# Patient Record
Sex: Male | Born: 1975 | Race: Black or African American | Hispanic: No | Marital: Single | Smoking: Never smoker
Health system: Southern US, Community
[De-identification: ages and names within clinical notes are randomized; demographics above are authoritative.]

## PROBLEM LIST (undated history)

## (undated) DIAGNOSIS — K469 Unspecified abdominal hernia without obstruction or gangrene: Secondary | ICD-10-CM

---

## 1999-08-15 ENCOUNTER — Emergency Department (HOSPITAL_COMMUNITY): Admission: EM | Admit: 1999-08-15 | Discharge: 1999-08-15 | Payer: Self-pay | Admitting: Emergency Medicine

## 1999-08-17 ENCOUNTER — Emergency Department (HOSPITAL_COMMUNITY): Admission: EM | Admit: 1999-08-17 | Discharge: 1999-08-17 | Payer: Self-pay | Admitting: Emergency Medicine

## 2000-07-07 ENCOUNTER — Encounter: Payer: Self-pay | Admitting: Emergency Medicine

## 2000-07-07 ENCOUNTER — Emergency Department (HOSPITAL_COMMUNITY): Admission: EM | Admit: 2000-07-07 | Discharge: 2000-07-07 | Payer: Self-pay | Admitting: Emergency Medicine

## 2001-01-25 ENCOUNTER — Emergency Department (HOSPITAL_COMMUNITY): Admission: EM | Admit: 2001-01-25 | Discharge: 2001-01-25 | Payer: Self-pay

## 2001-01-29 ENCOUNTER — Emergency Department (HOSPITAL_COMMUNITY): Admission: EM | Admit: 2001-01-29 | Discharge: 2001-01-30 | Payer: Self-pay | Admitting: Emergency Medicine

## 2001-01-29 ENCOUNTER — Encounter: Payer: Self-pay | Admitting: Emergency Medicine

## 2001-02-09 ENCOUNTER — Emergency Department (HOSPITAL_COMMUNITY): Admission: EM | Admit: 2001-02-09 | Discharge: 2001-02-09 | Payer: Self-pay | Admitting: Emergency Medicine

## 2001-02-09 ENCOUNTER — Encounter: Payer: Self-pay | Admitting: Emergency Medicine

## 2002-05-19 ENCOUNTER — Emergency Department (HOSPITAL_COMMUNITY): Admission: EM | Admit: 2002-05-19 | Discharge: 2002-05-19 | Payer: Self-pay | Admitting: Emergency Medicine

## 2002-06-26 ENCOUNTER — Encounter: Payer: Self-pay | Admitting: Emergency Medicine

## 2002-06-26 ENCOUNTER — Emergency Department (HOSPITAL_COMMUNITY): Admission: EM | Admit: 2002-06-26 | Discharge: 2002-06-26 | Payer: Self-pay | Admitting: Emergency Medicine

## 2002-07-02 ENCOUNTER — Emergency Department (HOSPITAL_COMMUNITY): Admission: EM | Admit: 2002-07-02 | Discharge: 2002-07-02 | Payer: Self-pay | Admitting: Emergency Medicine

## 2002-09-07 ENCOUNTER — Emergency Department (HOSPITAL_COMMUNITY): Admission: EM | Admit: 2002-09-07 | Discharge: 2002-09-07 | Payer: Self-pay | Admitting: Emergency Medicine

## 2002-09-07 ENCOUNTER — Encounter: Payer: Self-pay | Admitting: Emergency Medicine

## 2002-10-24 ENCOUNTER — Emergency Department (HOSPITAL_COMMUNITY): Admission: EM | Admit: 2002-10-24 | Discharge: 2002-10-25 | Payer: Self-pay | Admitting: Emergency Medicine

## 2004-04-13 ENCOUNTER — Emergency Department (HOSPITAL_COMMUNITY): Admission: EM | Admit: 2004-04-13 | Discharge: 2004-04-13 | Payer: Self-pay | Admitting: Emergency Medicine

## 2004-09-24 ENCOUNTER — Emergency Department (HOSPITAL_COMMUNITY): Admission: EM | Admit: 2004-09-24 | Discharge: 2004-09-24 | Payer: Self-pay | Admitting: Emergency Medicine

## 2004-12-22 ENCOUNTER — Emergency Department (HOSPITAL_COMMUNITY): Admission: EM | Admit: 2004-12-22 | Discharge: 2004-12-22 | Payer: Self-pay | Admitting: Emergency Medicine

## 2005-03-27 ENCOUNTER — Ambulatory Visit: Payer: Self-pay | Admitting: Internal Medicine

## 2005-03-27 ENCOUNTER — Ambulatory Visit: Payer: Self-pay | Admitting: *Deleted

## 2005-10-29 IMAGING — US US SCROTUM
1 series · 14 of 25 positions shown · non-contrast
Comparison: none

*****DUPLICATE COPY ****
 For exam association in [facility] - no change from original report - 09/29/04.
CLINICAL DATA: Pain.  Please evaluate for possible torsion.
 SCROTAL ULTRASOUND
 The testicles are normal in size and contour and there is normal arterial and venous color flow associated with the testicles.  The right epididymis is enlarged and there is increased color flow associated with the right epididymis ? findings consistent with epididymitis.  The left epididymis has a normal appearance.  There are small bilateral hydroceles.  There is no varicocele.

[Series 1: scrotum · 0.07mm/px · 14 of 44 slices shown]
[im 1/44]
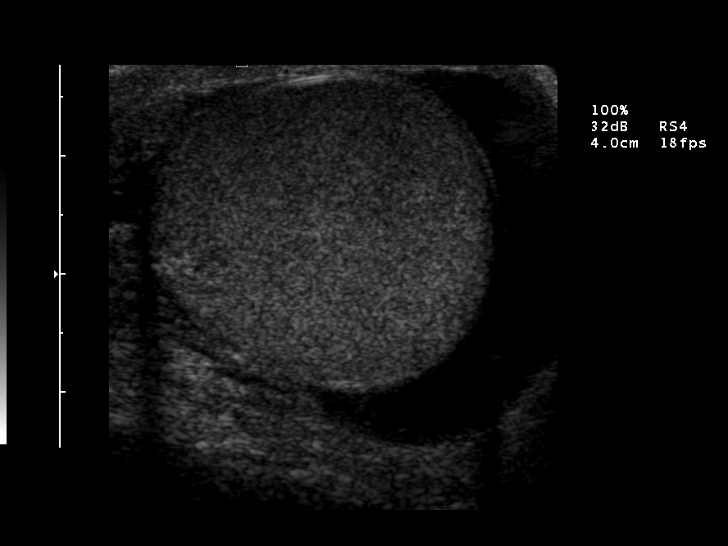
[im 4/44]
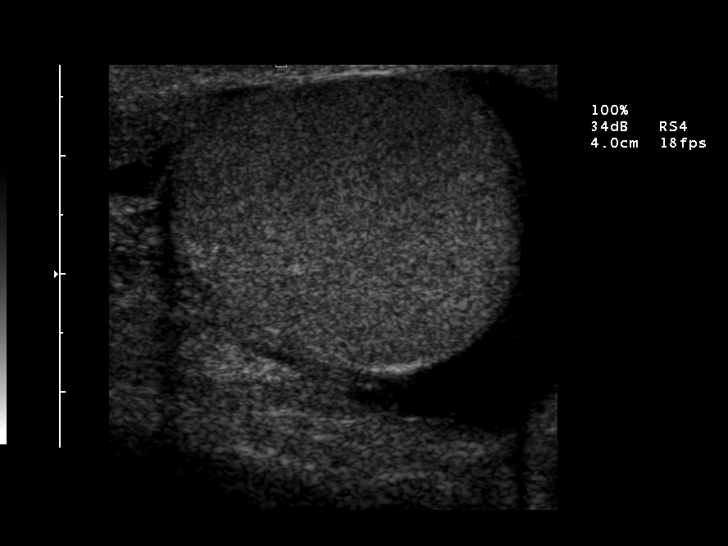
[im 8/44]
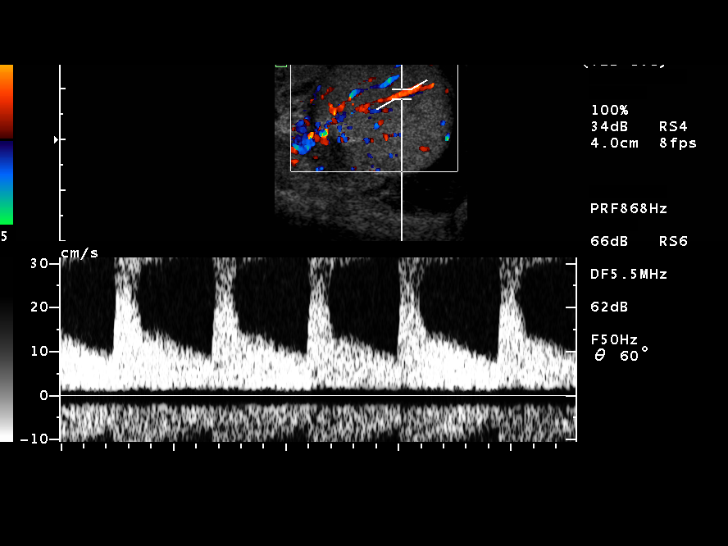
[im 11/44]
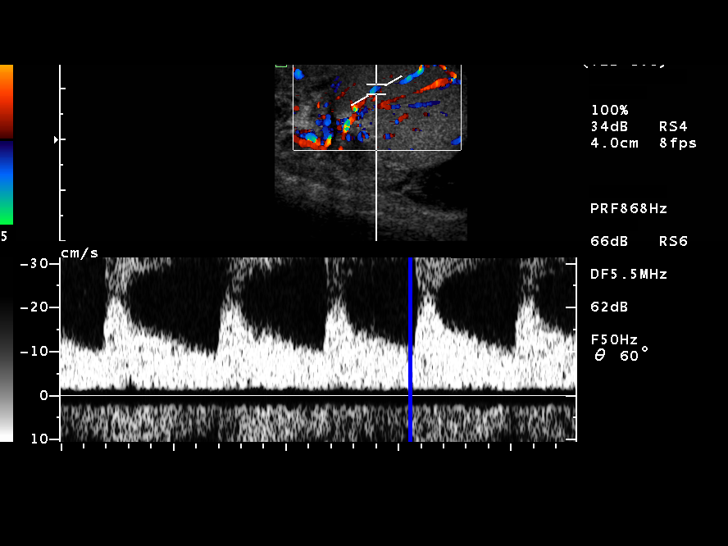
[im 15/44]
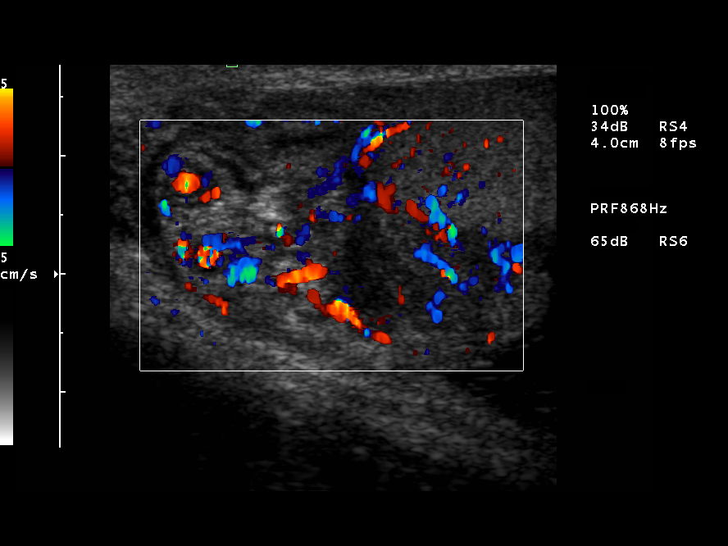
[im 17/44]
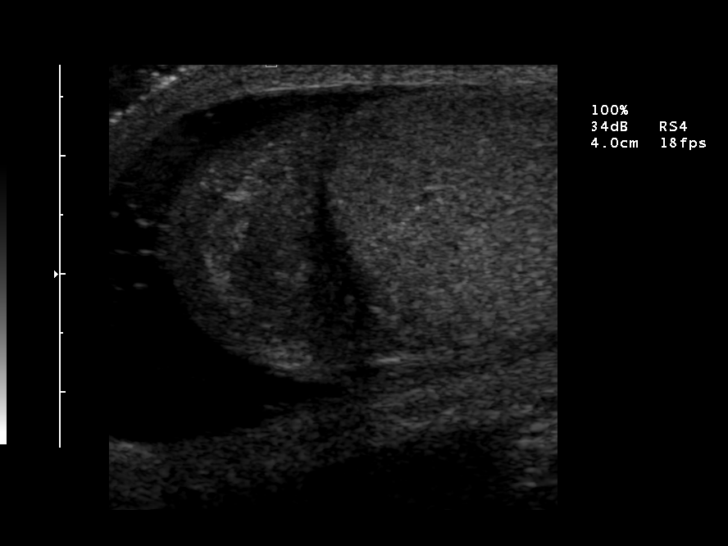
[im 20/44]
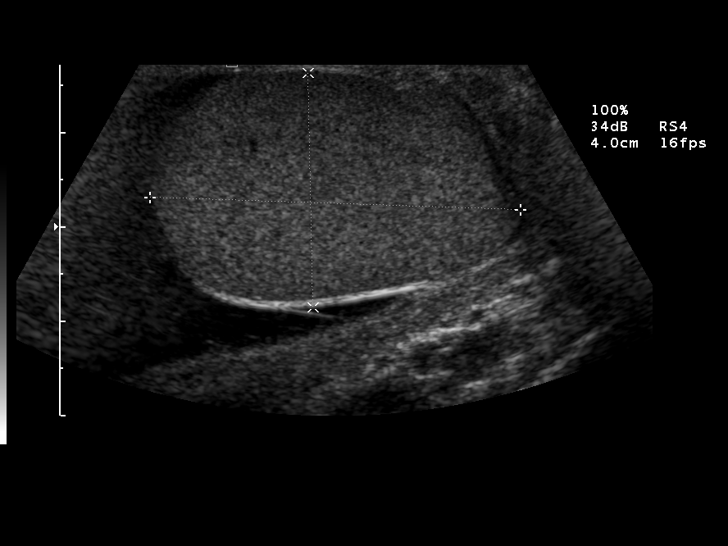
[im 24/44]
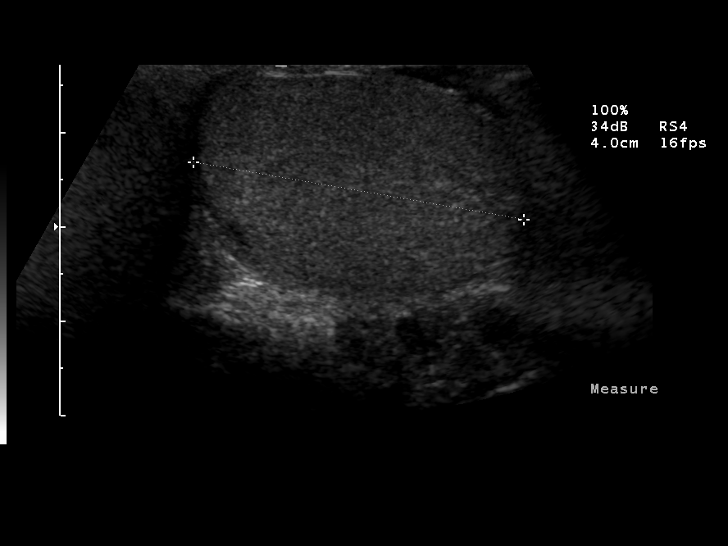
[im 27/44]
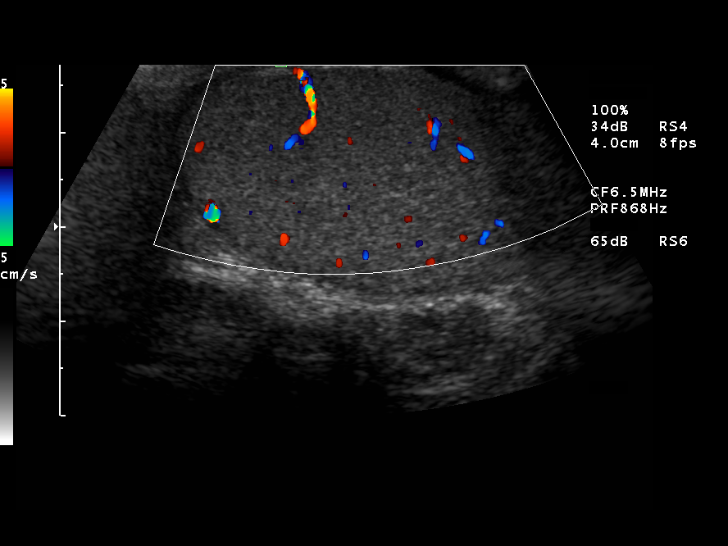
[im 29/44]
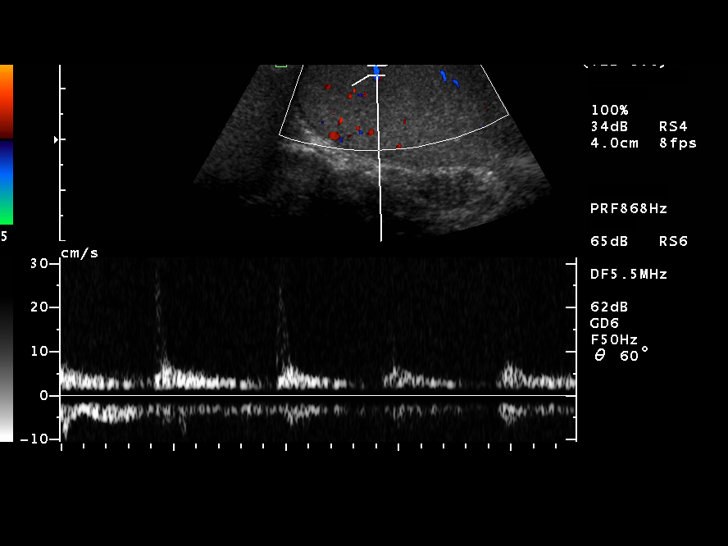
[im 33/44]
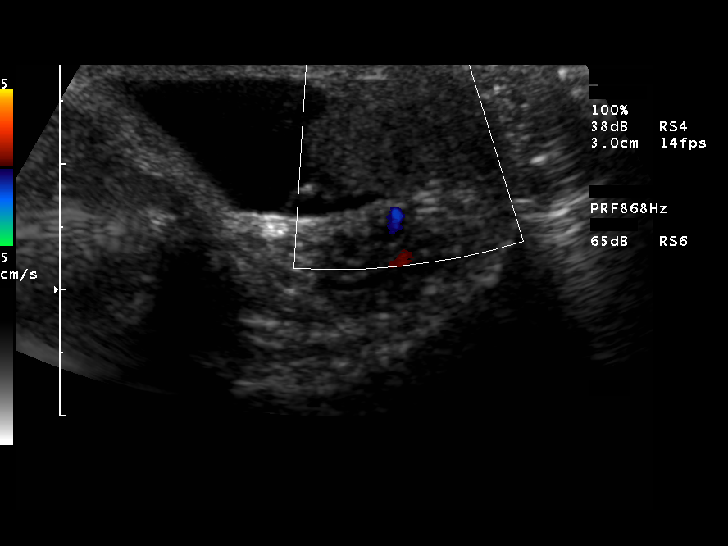
[im 36/44]
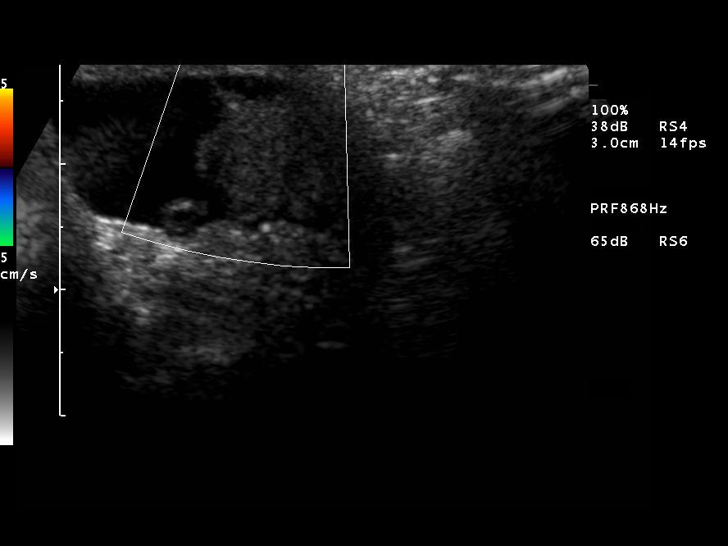
[im 40/44]
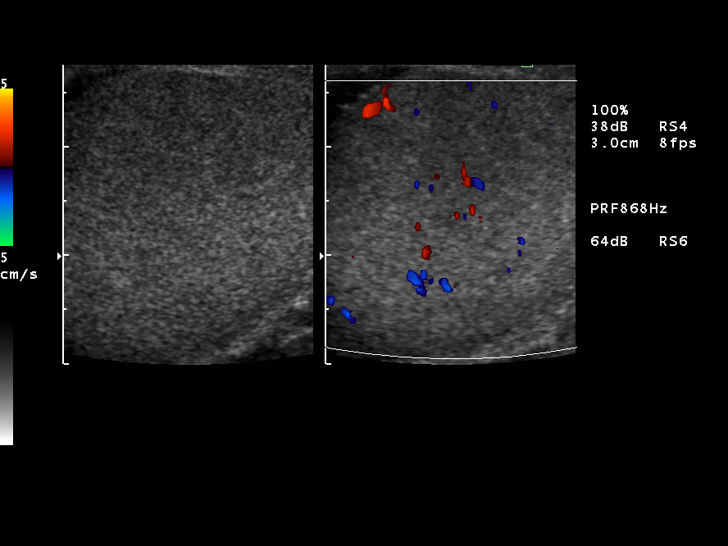
[im 44/44]
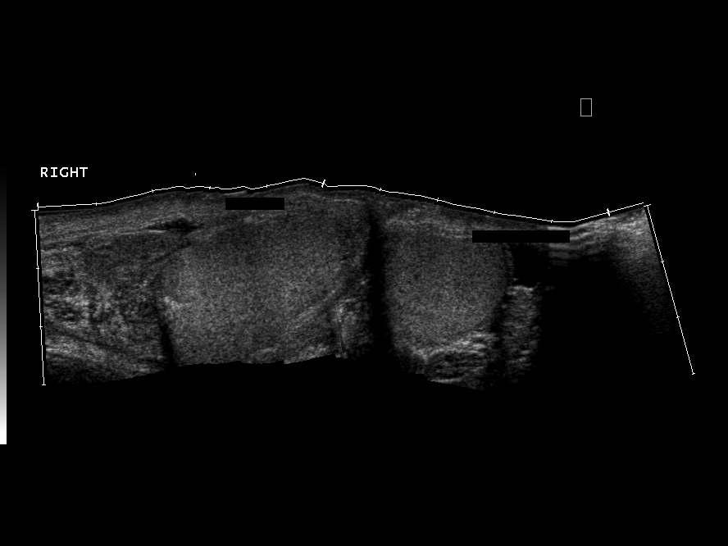

[14 of 25 positions shown; findings below may reference images not displayed]

IMPRESSION: No evidence for testicular torsion.  Findings are consistent with a right side epididymitis.  Small bilateral hydroceles.

## 2006-01-11 ENCOUNTER — Emergency Department (HOSPITAL_COMMUNITY): Admission: EM | Admit: 2006-01-11 | Discharge: 2006-01-11 | Payer: Self-pay | Admitting: Emergency Medicine

## 2006-01-26 IMAGING — US US SCROTUM
1 series · 14 of 25 positions shown · non-contrast
Comparison: none

CLINICAL DATA: Swollen right testicle.
 US SCROTUM/US ARTERIAL VENOUS FLOW DOPPLER:519
 Scans over the scrotum were performed.  Both testicles are normal in size and in echogenicity.  There is blood flow to both testicles with somewhat increased flow on the right relative to the left.  Also, there is prominence of the right epididymis, which is inhomogeneous with increased flow.  This suggests right epididymitis.  The left epididymis appears normal.  A right hydrocele is present.  No varicocele is noted.

[Series 1: scrot · 0.15mm/px · 14 of 40 slices shown]
[im 1/40]
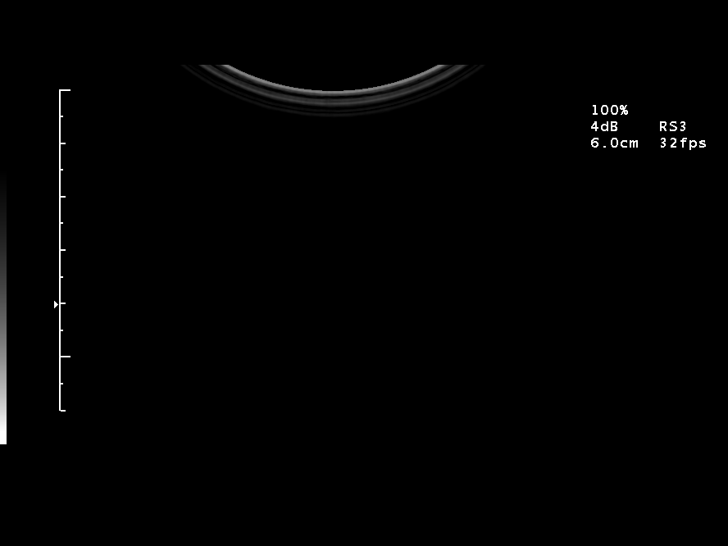
[im 4/40]
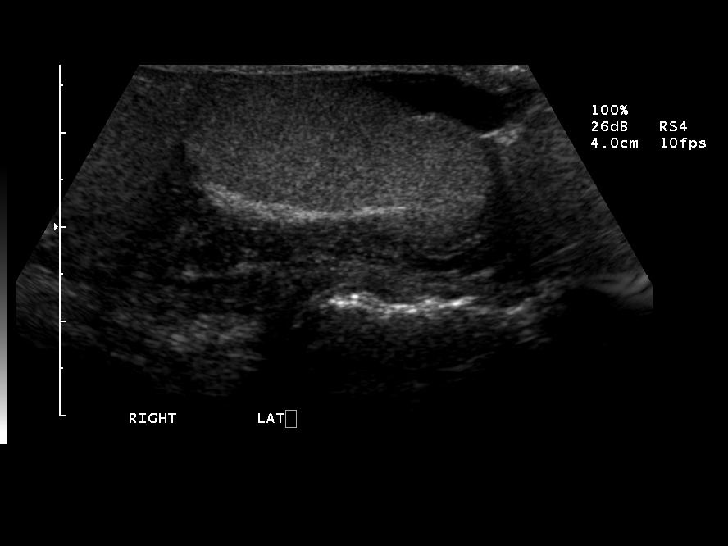
[im 7/40]
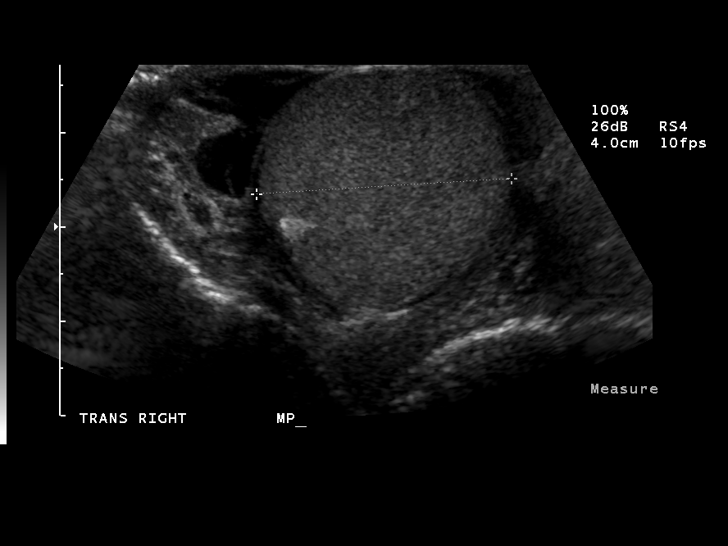
[im 10/40]
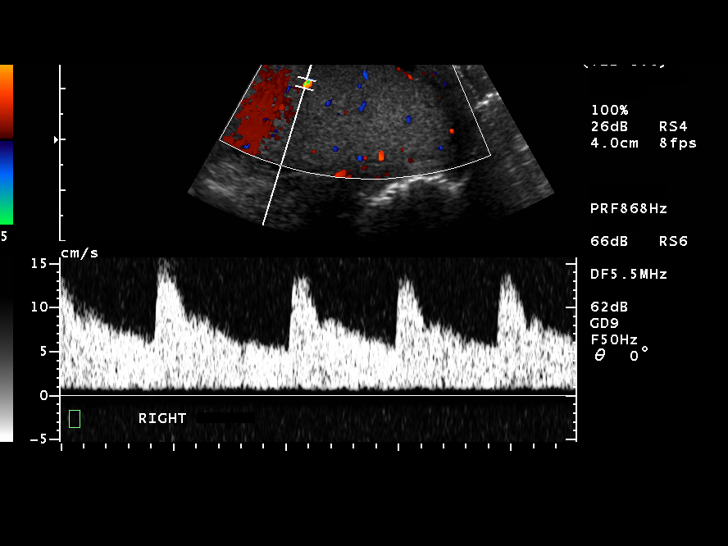
[im 14/40]
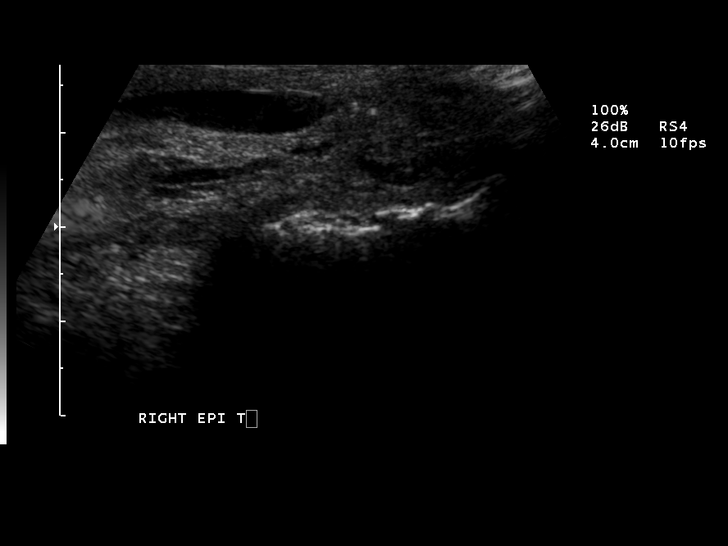
[im 15/40]
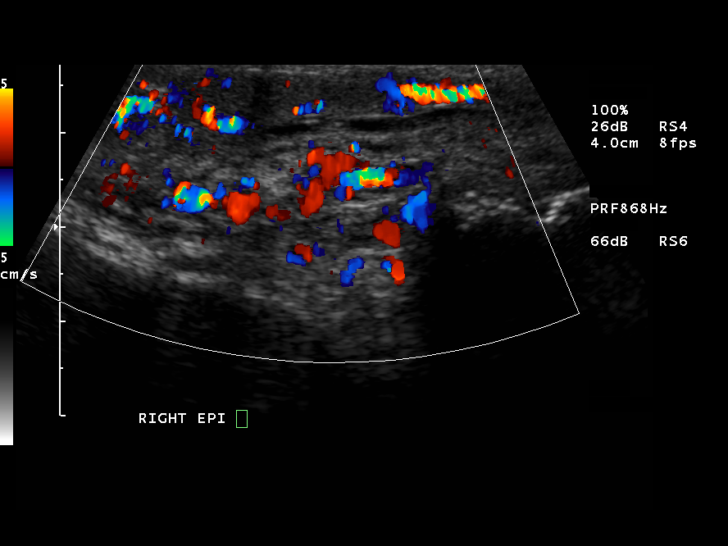
[im 18/40]
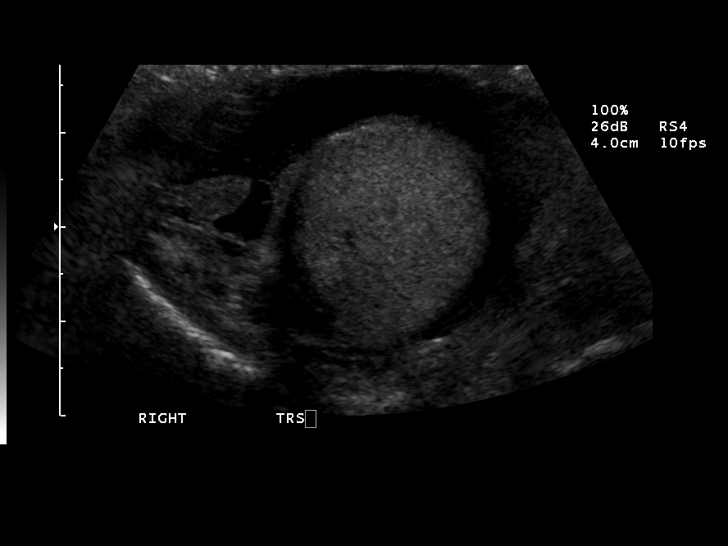
[im 22/40]
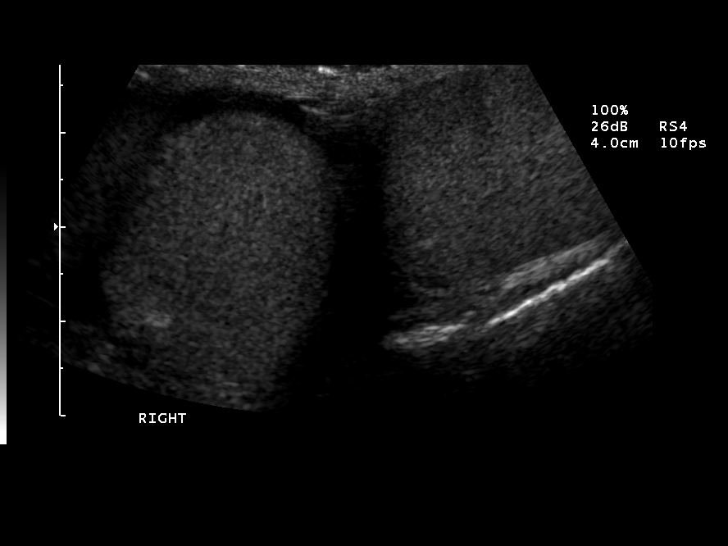
[im 25/40]
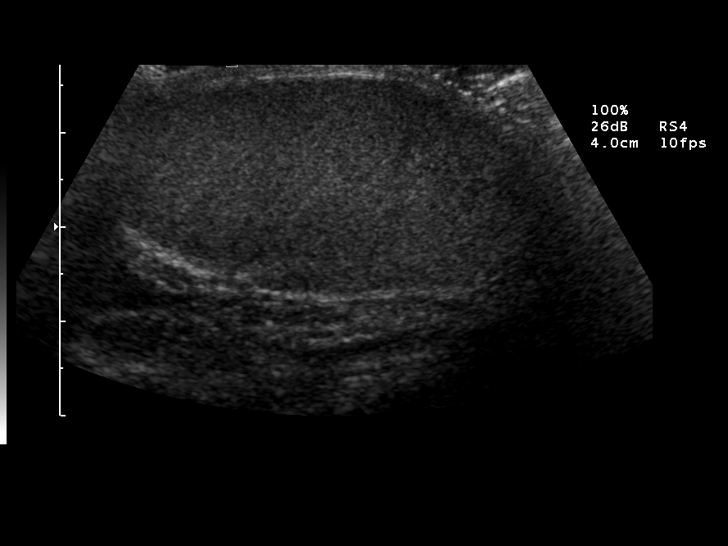
[im 27/40]
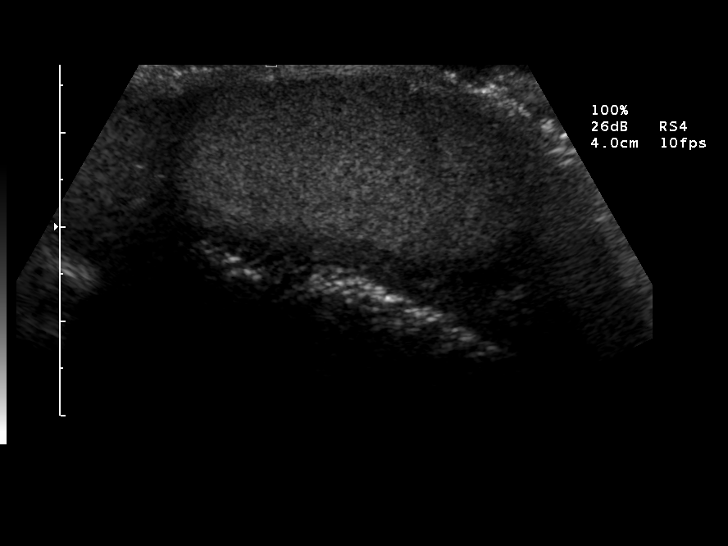
[im 30/40]
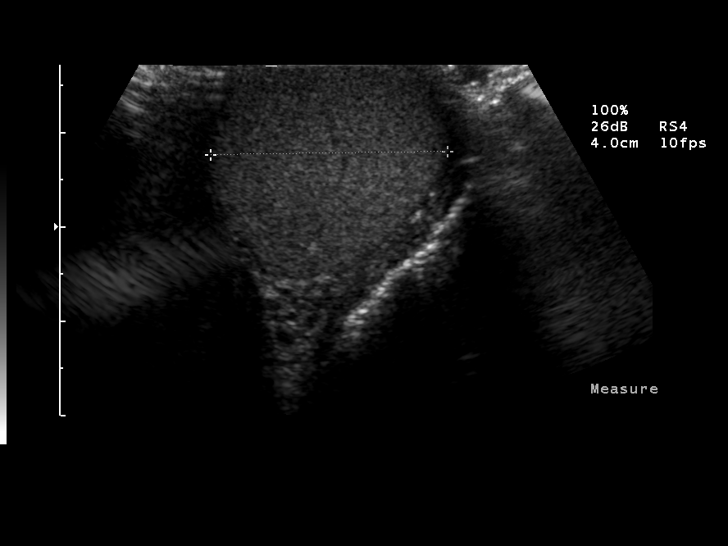
[im 33/40]
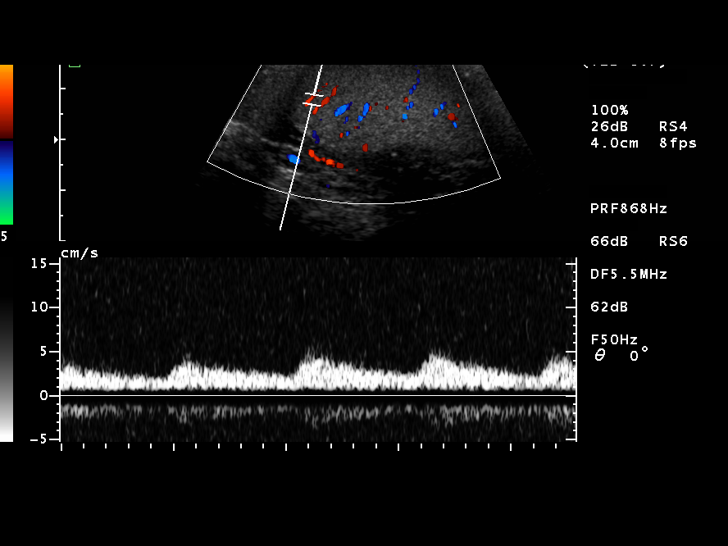
[im 36/40]
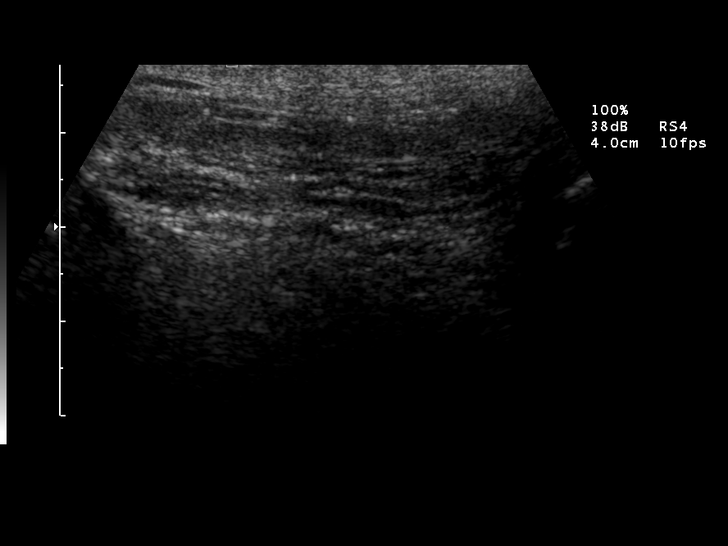
[im 40/40]
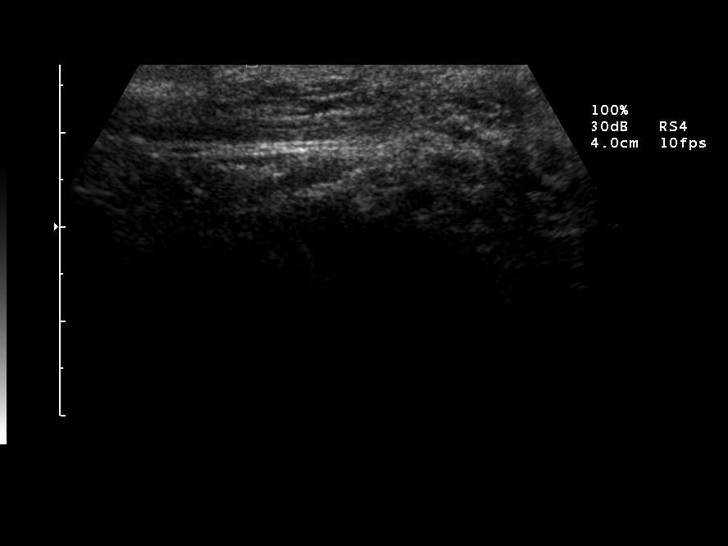

[14 of 25 positions shown; findings below may reference images not displayed]

IMPRESSION: 1.  Findings compatible with right epididymitis and right hydrocele.  There is a subtle increase in flow to the right testicle itself and orchitis cannot be excluded.  
 2.  No intratesticular abnormality is seen.

## 2006-02-27 ENCOUNTER — Emergency Department (HOSPITAL_COMMUNITY): Admission: EM | Admit: 2006-02-27 | Discharge: 2006-02-27 | Payer: Self-pay | Admitting: Emergency Medicine

## 2006-03-06 ENCOUNTER — Emergency Department (HOSPITAL_COMMUNITY): Admission: EM | Admit: 2006-03-06 | Discharge: 2006-03-06 | Payer: Self-pay | Admitting: Emergency Medicine

## 2006-05-02 ENCOUNTER — Emergency Department (HOSPITAL_COMMUNITY): Admission: EM | Admit: 2006-05-02 | Discharge: 2006-05-02 | Payer: Self-pay | Admitting: Family Medicine

## 2006-05-07 ENCOUNTER — Emergency Department (HOSPITAL_COMMUNITY): Admission: EM | Admit: 2006-05-07 | Discharge: 2006-05-07 | Payer: Self-pay | Admitting: Family Medicine

## 2006-06-29 ENCOUNTER — Emergency Department (HOSPITAL_COMMUNITY): Admission: EM | Admit: 2006-06-29 | Discharge: 2006-06-30 | Payer: Self-pay | Admitting: Emergency Medicine

## 2007-08-03 IMAGING — US US ART/VEN ABD/PELV/SCROTUM DOPPLER COMPLETE
1 series · 14 of 25 positions shown · non-contrast
Comparison: none

CLINICAL DATA: Right-sided testicular pain and swelling, 30-year-old male.  
SCROTAL ULTRASOUND:
DOPPLER ULTRASOUND OF THE TESTICLES:
TECHNIQUE: Complete ultrasound examination of the testicles, epididymis, and other scrotal structures was performed.  Color and spectral Doppler ultrasound were also utilized to evaluate blood flow to the testicles.

[Series 1: unknown · 0.09mm/px · 14 of 37 slices shown]
[im 1/37]
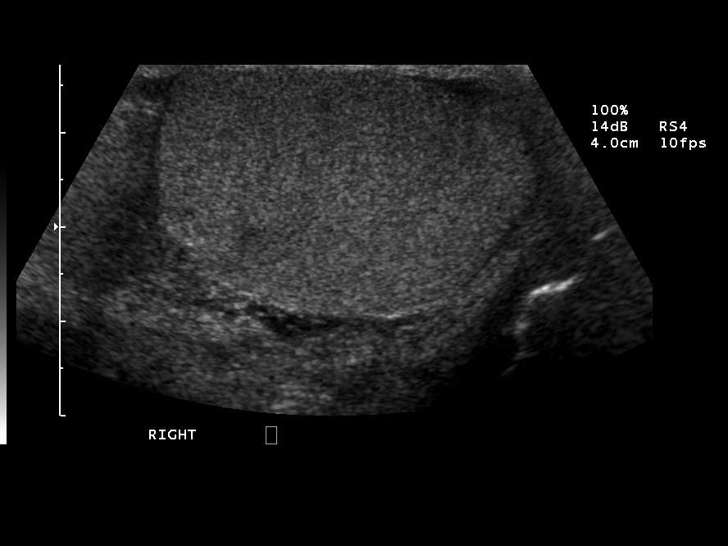
[im 4/37]
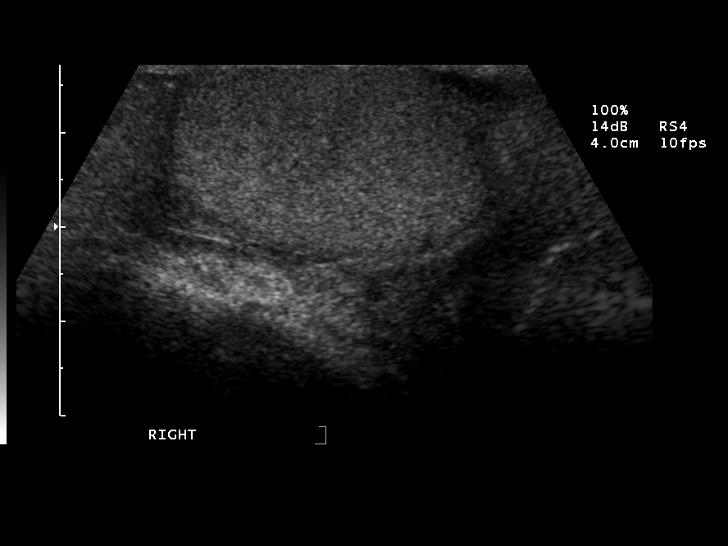
[im 7/37]
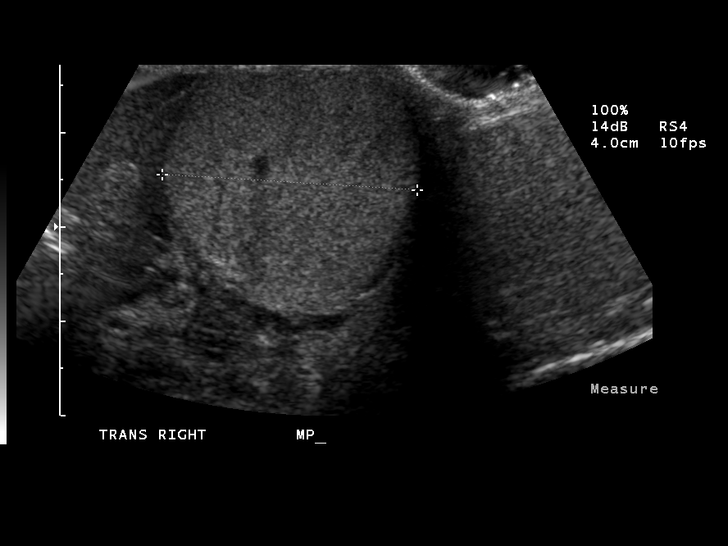
[im 10/37]
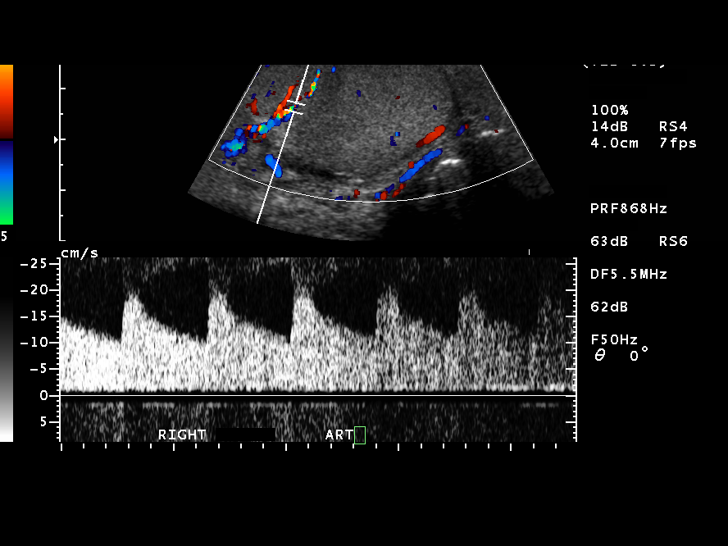
[im 13/37]
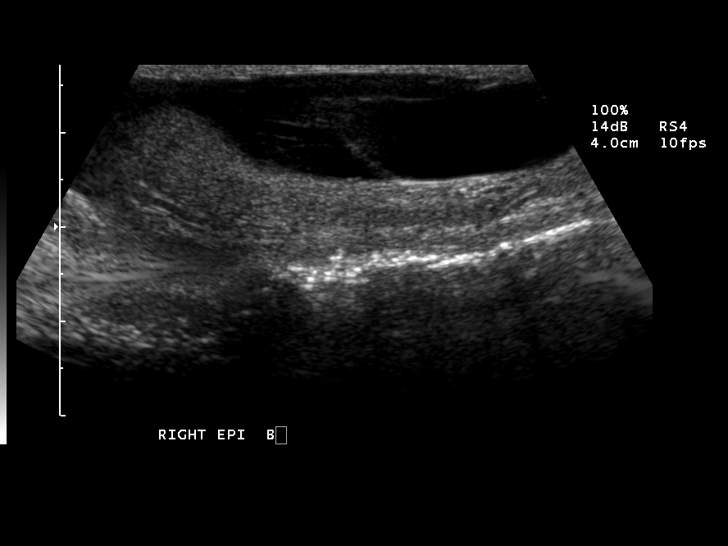
[im 14/37]
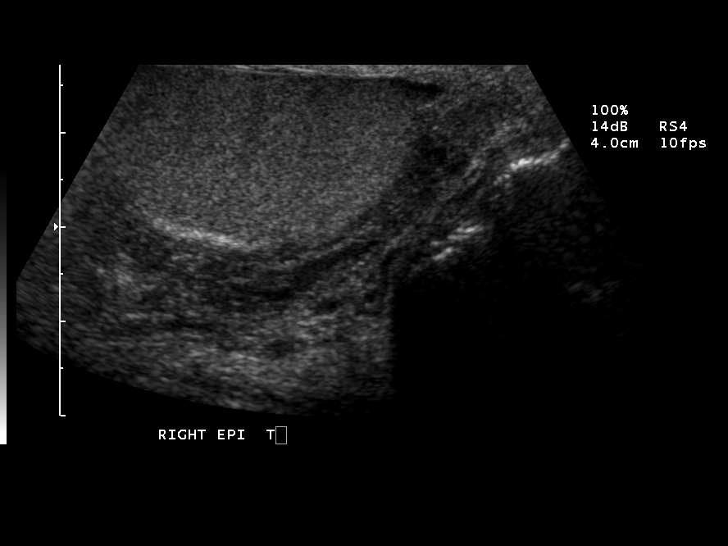
[im 17/37]
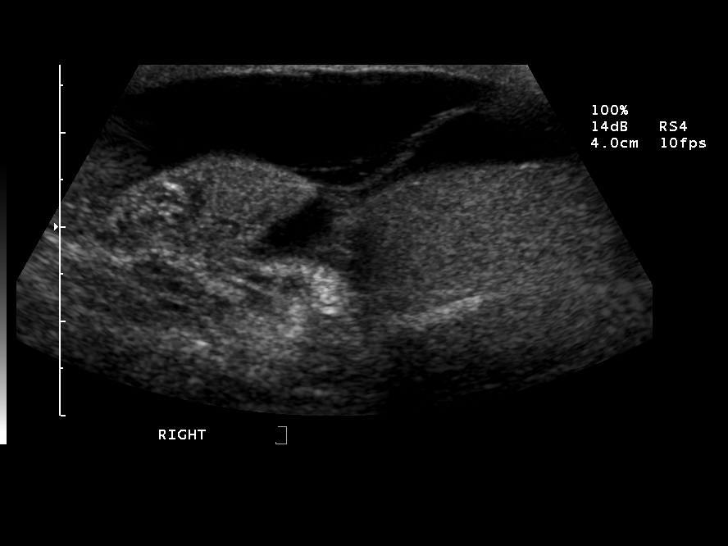
[im 20/37]
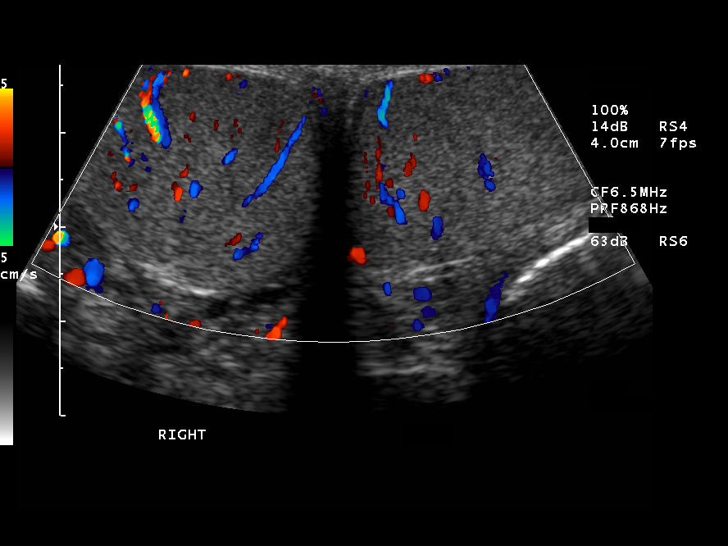
[im 23/37]
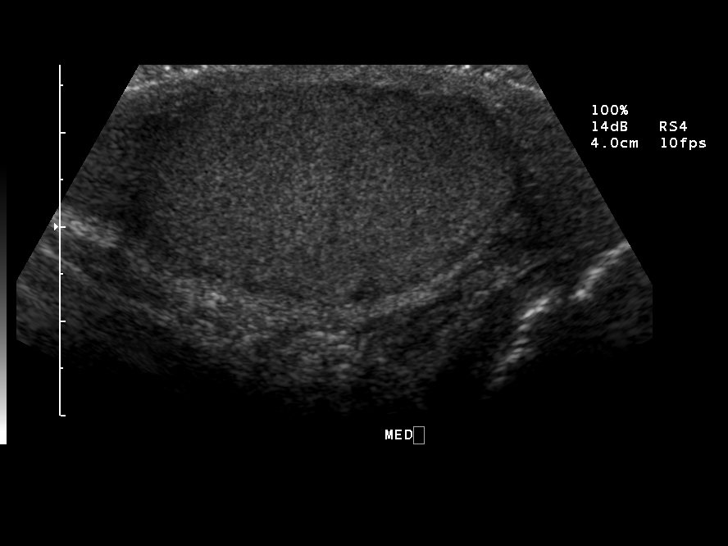
[im 25/37]
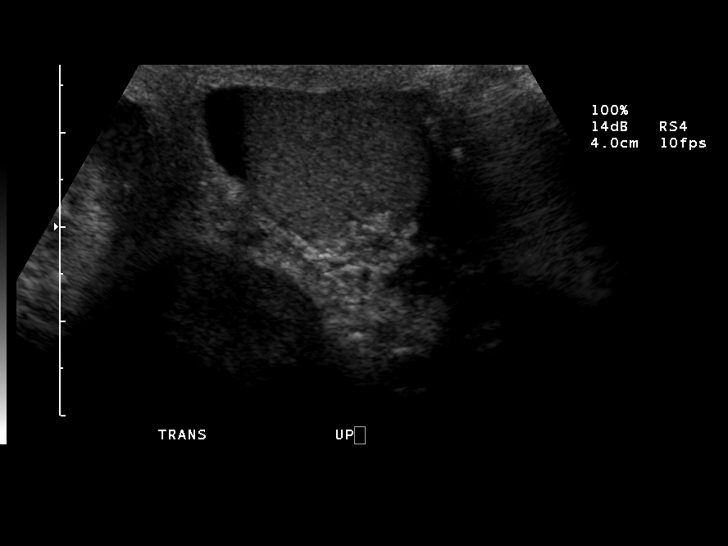
[im 28/37]
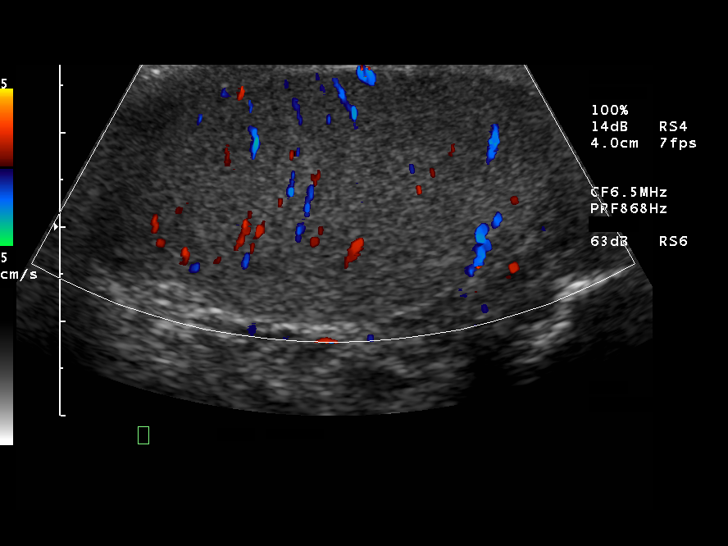
[im 31/37]
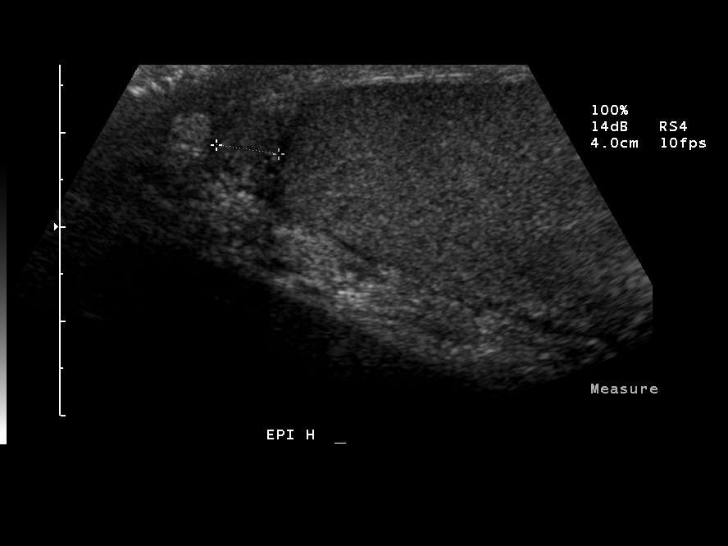
[im 34/37]
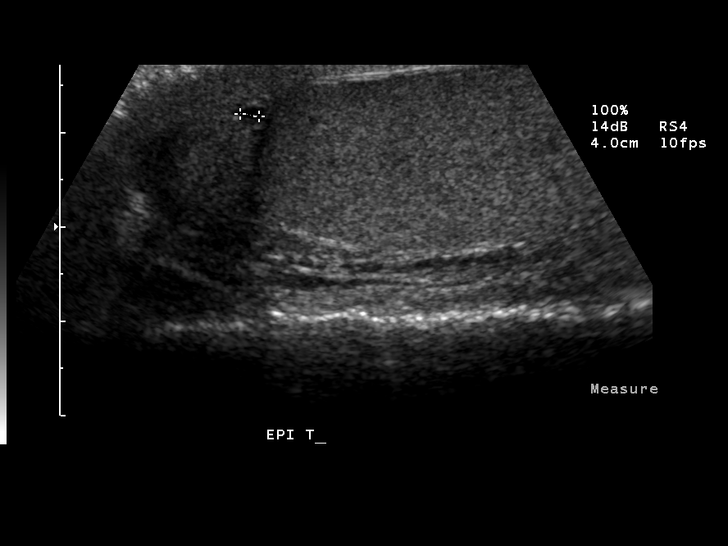
[im 37/37]
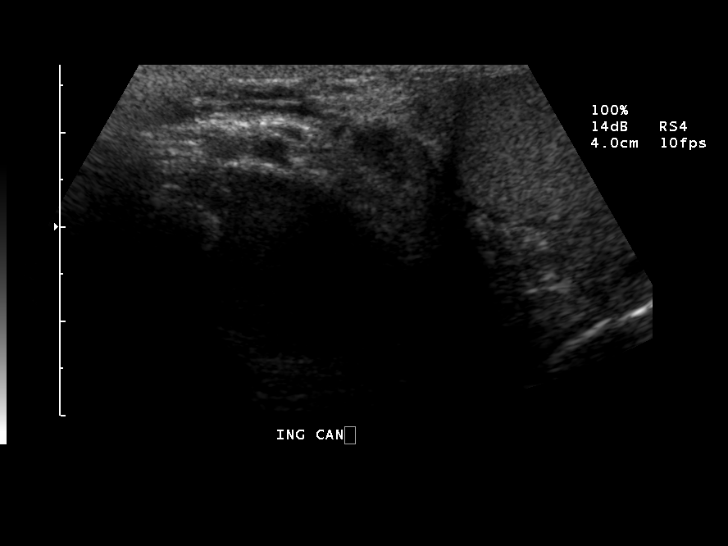

[14 of 25 positions shown; findings below may reference images not displayed]

FINDINGS: Right testis:  4.2 x 2.7 x 2.5 cm.
Right epididymis measures 1.8 cm maximally.  
Left testis:  4.5 x 2.4 x 2.3 cm.
Left epididymis measures 7 mm maximally.
The testes are equal in echogenicity bilaterally without focal mass.  There is markedly increased color flow vascularity to the right epididymal head with internal heterogeneity.  Mild to moderate right hydrocele is present.  No left-sided hydrocele is present.  
Low resistance arterial and venous waveforms are present in both testes.
IMPRESSION: 1.  Findings are consistent with right-sided epididymitis.  
2.  No intratesticular mass or evidence for torsion.

## 2008-04-28 ENCOUNTER — Emergency Department (HOSPITAL_COMMUNITY): Admission: EM | Admit: 2008-04-28 | Discharge: 2008-04-28 | Payer: Self-pay | Admitting: Emergency Medicine

## 2008-05-01 ENCOUNTER — Emergency Department (HOSPITAL_COMMUNITY): Admission: EM | Admit: 2008-05-01 | Discharge: 2008-05-01 | Payer: Self-pay | Admitting: Family Medicine

## 2009-08-26 ENCOUNTER — Emergency Department (HOSPITAL_COMMUNITY): Admission: EM | Admit: 2009-08-26 | Discharge: 2009-08-26 | Payer: Self-pay | Admitting: Emergency Medicine

## 2017-03-10 ENCOUNTER — Ambulatory Visit (HOSPITAL_COMMUNITY)
Admission: EM | Admit: 2017-03-10 | Discharge: 2017-03-10 | Disposition: A | Discharge status: Home / Self Care | Payer: Self-pay | Attending: Family Medicine | Admitting: Family Medicine

## 2017-03-10 ENCOUNTER — Encounter (HOSPITAL_COMMUNITY): Payer: Self-pay | Admitting: Emergency Medicine

## 2017-03-10 DIAGNOSIS — J40 Bronchitis, not specified as acute or chronic: Secondary | ICD-10-CM

## 2017-03-10 DIAGNOSIS — J4521 Mild intermittent asthma with (acute) exacerbation: Secondary | ICD-10-CM

## 2017-03-10 MED ORDER — ALBUTEROL SULFATE HFA 108 (90 BASE) MCG/ACT IN AERS: 2.0000 | INHALATION_SPRAY | RESPIRATORY_TRACT | 1 refills | Status: DC | PRN

## 2017-03-10 MED ORDER — PREDNISONE 20 MG PO TABS: ORAL_TABLET | ORAL | 0 refills | Status: DC

## 2017-03-10 NOTE — ED Triage Notes (Signed)
Pt here for SOB onset 2 weeks associated w/prod cough and wheezing  A&O x4... NAD

## 2017-03-10 NOTE — ED Provider Notes (Signed)
[facility]    CSN: [id number] Arrival date & time: 03/10/17  1312     History   Chief Complaint Chief Complaint  Patient presents with  . Shortness of Breath    HPI Noah Miller is a 41 y.o. male.   Pt here for SOB onset 2 weeks associated w/prod cough and wheezing.  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  He has a h/o asthma.  Patient denies fever.      History reviewed. No pertinent past medical history.  There are no active problems to display for this patient.   History reviewed. No pertinent surgical history.     Home Medications    Prior to Admission medications   Medication Sig Start Date End Date Taking? Authorizing Provider  albuterol (PROVENTIL HFA;VENTOLIN HFA) 108 (90 Base) MCG/ACT inhaler Inhale 2 puffs into the lungs every 4 (four) hours as needed for wheezing or shortness of breath (cough, shortness of breath or wheezing.). 03/10/17   Elvina Sidle, MD  predniSONE (DELTASONE) 20 MG tablet Two daily with food 03/10/17   Elvina Sidle, MD    Family History History reviewed. No pertinent family history.  Social History Social History  Substance Use Topics  . Smoking status: Never Smoker  . Smokeless tobacco: Never Used  . Alcohol use No     Allergies   Patient has no known allergies.   Review of Systems Review of Systems  Constitutional: Negative.  Negative for fever.  HENT: Positive for congestion.   Eyes: Positive for itching.  Respiratory: Positive for cough.   Gastrointestinal: Negative.   Genitourinary: Negative.   Neurological: Negative.      Physical Exam Triage Vital Signs ED Triage Vitals [03/10/17 1426]  Enc Vitals Group     BP 132/78     Pulse Rate 78     Resp 20     Temp 97.8 F (36.6 C)     Temp Source Oral     SpO2 98 %     Weight      Height      Head Circumference      Peak Flow      Pain Score      Pain Loc      Pain Edu?      Excl. in GC?    No data  found.   Updated Vital Signs BP 132/78 (BP Location: Right Arm)   Pulse 78   Temp 97.8 F (36.6 C) (Oral)   Resp 20   SpO2 98%    Physical Exam  Constitutional: He is oriented to person, place, and time. He appears well-developed and well-nourished.  HENT:  Right Ear: External ear normal.  Left Ear: External ear normal.  Mouth/Throat: Oropharynx is clear and moist.  Eyes: Conjunctivae and EOM are normal. Pupils are equal, round, and reactive to light.  Neck: Normal range of motion. Neck supple.  Cardiovascular: Normal rate, regular rhythm and normal heart sounds.   Pulmonary/Chest: Effort normal. He has wheezes.  Musculoskeletal: Normal range of motion.  Neurological: He is alert and oriented to person, place, and time.  Skin: Skin is warm and dry.  Nursing note and vitals reviewed.    UC Treatments / Results  Labs (all labs ordered are listed, but only abnormal results are displayed) Labs Reviewed - No data to display  EKG  EKG Interpretation None       Radiology No results found.  Procedures Procedures (including critical care time)  Medications Ordered in UC Medications - No data to display   Initial Impression / Assessment and Plan / UC Course  I have reviewed the triage vital signs and the nursing notes.  Pertinent labs & imaging results that were available during my care of the patient were reviewed by me and considered in my medical decision making (see chart for details).     Final Clinical Impressions(s) / UC Diagnoses   Final diagnoses:  Bronchitis  Mild intermittent asthma with exacerbation    New Prescriptions New Prescriptions   ALBUTEROL (PROVENTIL HFA;VENTOLIN HFA) 108 (90 BASE) MCG/ACT INHALER    Inhale 2 puffs into the lungs every 4 (four) hours as needed for wheezing or shortness of breath (cough, shortness of breath or wheezing.).   PREDNISONE (DELTASONE) 20 MG TABLET    Two daily with food     Elvina Sidle, MD 03/10/17  1441

## 2017-10-02 ENCOUNTER — Ambulatory Visit (HOSPITAL_COMMUNITY)
Admission: EM | Admit: 2017-10-02 | Discharge: 2017-10-02 | Disposition: A | Discharge status: Home / Self Care | Payer: Self-pay | Attending: Emergency Medicine | Admitting: Emergency Medicine

## 2017-10-02 ENCOUNTER — Encounter (HOSPITAL_COMMUNITY): Payer: Self-pay | Admitting: Emergency Medicine

## 2017-10-02 DIAGNOSIS — J069 Acute upper respiratory infection, unspecified: Secondary | ICD-10-CM

## 2017-10-02 DIAGNOSIS — B9789 Other viral agents as the cause of diseases classified elsewhere: Secondary | ICD-10-CM

## 2017-10-02 MED ORDER — ALBUTEROL SULFATE HFA 108 (90 BASE) MCG/ACT IN AERS: 2.0000 | INHALATION_SPRAY | RESPIRATORY_TRACT | 0 refills | Status: DC | PRN

## 2017-10-02 MED ORDER — ALBUTEROL SULFATE HFA 108 (90 BASE) MCG/ACT IN AERS: 1.0000 | INHALATION_SPRAY | Freq: Four times a day (QID) | RESPIRATORY_TRACT | 0 refills | Status: DC | PRN

## 2017-10-02 MED ORDER — AEROCHAMBER PLUS MISC: 2 refills | Status: DC

## 2017-10-02 MED ORDER — BENZONATATE 200 MG PO CAPS: 200.0000 mg | ORAL_CAPSULE | Freq: Three times a day (TID) | ORAL | 0 refills | Status: DC | PRN

## 2017-10-02 MED ORDER — FLUTICASONE PROPIONATE 50 MCG/ACT NA SUSP: 2.0000 | Freq: Every day | NASAL | 0 refills | Status: DC

## 2017-10-02 NOTE — ED Triage Notes (Signed)
Pt here for cough and congestion with one episode of emesis last night

## 2017-10-02 NOTE — ED Provider Notes (Signed)
HPI  SUBJECTIVE:  Noah Miller is a 41 y.o. male who presents with 2 days of a cough productive of yellow mucus, wheezing, postnasal drip.  He reports one episode of emesis last night, but denies posttussive emesis.  States that he had sneezing and rhinorrhea this weekend, but is since resolved.  No fevers, nasal congestion, sinus pain or pressure.  No shortness of breath, chest pain.  No abdominal pain.  He is tolerating p.o. today.  No allergy type symptoms, GERD type symptoms.  No antipyretic in the past 6-8 hours.  No antibiotic in the past month.  Patient states that he is sleeping well, is not waking up at night coughing.  He has tried increasing his fruit intake, warm beverages and Tylenol with improvement in symptoms.  No aggravating factors.  He has a past medical history of seasonal allergies in the spring, asthma.  No history of emphysema, COPD, smoking, diabetes, hypertension.  PMD: None.   History reviewed. No pertinent past medical history.  History reviewed. No pertinent surgical history.  History reviewed. No pertinent family history.  Social History   Tobacco Use  . Smoking status: Never Smoker  . Smokeless tobacco: Never Used  Substance Use Topics  . Alcohol use: No  . Drug use: No    No current facility-administered medications for this encounter.   Current Outpatient Medications:  .  albuterol (PROVENTIL HFA;VENTOLIN HFA) 108 (90 Base) MCG/ACT inhaler, Inhale 2 puffs every 4 (four) hours as needed into the lungs for wheezing or shortness of breath (cough, shortness of breath or wheezing.)., Disp: 1 Inhaler, Rfl: 0 .  albuterol (PROVENTIL HFA;VENTOLIN HFA) 108 (90 Base) MCG/ACT inhaler, Inhale 1-2 puffs every 6 (six) hours as needed into the lungs for wheezing or shortness of breath., Disp: 1 Inhaler, Rfl: 0 .  benzonatate (TESSALON) 200 MG capsule, Take 1 capsule (200 mg total) 3 (three) times daily as needed by mouth for cough., Disp: 20 capsule, Rfl: 0 .   fluticasone (FLONASE) 50 MCG/ACT nasal spray, Place 2 sprays daily into both nostrils., Disp: 16 g, Rfl: 0 .  predniSONE (DELTASONE) 20 MG tablet, Two daily with food, Disp: 10 tablet, Rfl: 0 .  Spacer/Aero-Holding Chambers (AEROCHAMBER PLUS) inhaler, Use as instructed, Disp: 1 each, Rfl: 2  No Known Allergies   ROS  As noted in HPI.   Physical Exam  BP 133/86 (BP Location: Right Arm)   Pulse 85   Temp 98.6 F (37 C) (Oral)   Resp 16   SpO2 97%   Constitutional: Well developed, well nourished, no acute distress Eyes:  EOMI, conjunctiva normal bilaterally HENT: Normocephalic, atraumatic,mucus membranes moist.  Positive clear rhinorrhea.  Normal turbinates.  No sinus tenderness.  Unable to completely visualize oropharynx due to patient's tongue. Respiratory: Normal inspiratory effort lungs clear bilaterally, good air movement.  No chest wall tenderness  cardiovascular: Normal rate, no murmurs, rubs, gallops GI: nondistended skin: No rash, skin intact Musculoskeletal: no deformities Neurologic: Alert & oriented x 3, no focal neuro deficits Psychiatric: Speech and behavior appropriate   ED Course   Medications - No data to display  No orders of the defined types were placed in this encounter.   No results found for this or any previous visit (from the past 24 hour(s)). No results found.  ED Clinical Impression  Viral URI with cough   ED Assessment/Plan  Patient with URI.  Feel that the cough is most likely from the postnasal drip.  Doubt pneumonia.  Will  send home with Mucinex D, Flonase, Tessalon, and albuterol inhaler with a spacer.  Will provide primary care referral.   Discussed  MDM, plan and followup with patient. Discussed sn/sx that should prompt return to the ED. patient agrees with plan.   Meds ordered this encounter  Medications  . albuterol (PROVENTIL HFA;VENTOLIN HFA) 108 (90 Base) MCG/ACT inhaler    Sig: Inhale 2 puffs every 4 (four) hours as needed  into the lungs for wheezing or shortness of breath (cough, shortness of breath or wheezing.).    Dispense:  1 Inhaler    Refill:  0  . fluticasone (FLONASE) 50 MCG/ACT nasal spray    Sig: Place 2 sprays daily into both nostrils.    Dispense:  16 g    Refill:  0  . albuterol (PROVENTIL HFA;VENTOLIN HFA) 108 (90 Base) MCG/ACT inhaler    Sig: Inhale 1-2 puffs every 6 (six) hours as needed into the lungs for wheezing or shortness of breath.    Dispense:  1 Inhaler    Refill:  0  . Spacer/Aero-Holding Chambers (AEROCHAMBER PLUS) inhaler    Sig: Use as instructed    Dispense:  1 each    Refill:  2  . benzonatate (TESSALON) 200 MG capsule    Sig: Take 1 capsule (200 mg total) 3 (three) times daily as needed by mouth for cough.    Dispense:  20 capsule    Refill:  0    *This clinic note was created using Scientist, clinical (histocompatibility and immunogenetics)Dragon dictation software. Therefore, there may be occasional mistakes despite careful proofreading.   ?    Domenick GongMortenson, Oliverio Cho, MD 10/02/17 1214

## 2017-10-02 NOTE — Discharge Instructions (Signed)
1-2 puffs 3 albuterol inhaler using her spacer every 4-6 hours as needed for coughing, wheezing, shortness of breath.  Start some Mucinex D to help keep things thin so that you can cough it up.  Below is a list of primary care practices who are taking new patients for you to follow-up with. [facility] [address] [place], [place] [address] [phone]  [facility] [phone] [address] [place] [address]  [facility]: [address] [place] [address]  [phone]  [facility]: [address] [place] [address]   [phone]  [facility]: [address] [place] [address]  [phone]  [facility] : [address] [place] [address] [phone]  [facility]: [address] [place] [phone]  [facility]: [address] [place] [address] [phone]  Dr. Oneal GroutMahima Pandey [address] [facility] [address] [address] [facility] [place] [address]  [phone]  Dr. Jackie PlumGeorge Osei-Bonsu, [facility]. [address] [place], [place] [address]  [phone]  Go to www.goodrx.com to look up your medications. This will give you a list of where you can find your prescriptions at the most affordable prices. Or ask the pharmacist what the cash price is, or if they have any other discount programs available to help make your medication more affordable. This can be less expensive than what you would pay with insurance.

## 2017-10-03 ENCOUNTER — Telehealth (HOSPITAL_COMMUNITY): Payer: Self-pay | Admitting: *Deleted

## 2017-10-03 MED ORDER — BENZONATATE 200 MG PO CAPS: 200.0000 mg | ORAL_CAPSULE | Freq: Three times a day (TID) | ORAL | 0 refills | Status: DC | PRN

## 2019-05-15 ENCOUNTER — Other Ambulatory Visit: Payer: Self-pay

## 2019-05-15 ENCOUNTER — Emergency Department (HOSPITAL_COMMUNITY)
Admission: EM | Admit: 2019-05-15 | Discharge: 2019-05-15 | Disposition: A | Discharge status: Home / Self Care | Payer: Self-pay | Attending: Emergency Medicine | Admitting: Emergency Medicine

## 2019-05-15 ENCOUNTER — Encounter (HOSPITAL_COMMUNITY): Payer: Self-pay | Admitting: Emergency Medicine

## 2019-05-15 DIAGNOSIS — Z79899 Other long term (current) drug therapy: Secondary | ICD-10-CM | POA: Insufficient documentation

## 2019-05-15 DIAGNOSIS — N451 Epididymitis: Secondary | ICD-10-CM | POA: Insufficient documentation

## 2019-05-15 HISTORY — DX: Unspecified abdominal hernia without obstruction or gangrene: K46.9

## 2019-05-15 LAB — URINALYSIS, ROUTINE W REFLEX MICROSCOPIC
Bilirubin Urine: NEGATIVE
Glucose, UA: NEGATIVE mg/dL
Hgb urine dipstick: NEGATIVE
Ketones, ur: NEGATIVE mg/dL
Leukocytes,Ua: NEGATIVE
Nitrite: NEGATIVE
Protein, ur: NEGATIVE mg/dL
Specific Gravity, Urine: 1.018 (ref 1.005–1.030)
pH: 7 (ref 5.0–8.0)

## 2019-05-15 MED ORDER — CIPROFLOXACIN HCL 500 MG PO TABS: 500.0000 mg | ORAL_TABLET | Freq: Two times a day (BID) | ORAL | 0 refills | Status: DC

## 2019-05-15 NOTE — Discharge Instructions (Signed)
The urine sample did not show any serious problems.  We are prescribing an antibiotic to treat for epididymitis.  Sometimes this is a bacterial infection, other times it is inflammation.  You can also try treating the discomfort with ibuprofen 400 mg 3 times a day.  Make sure that you are drinking plenty of fluids every day.  We are referring you to a urologist to see if you continue to have problems with the pain in your scrotum.

## 2019-05-15 NOTE — ED Provider Notes (Signed)
[facility] DEPT Provider Note   CSN: [id number] Arrival date & time: 05/15/19  1125    History   Chief Complaint Chief Complaint  Patient presents with  . Hernia    HPI Noah Miller is a 43 y.o. male.     HPI   He has noticed pain in his right testicle after doing some lifting, several days ago.  Pain persisted at work the next day, and is notable on and off now, with movement.  He denies nausea, vomiting, change in bowel or urinary habits.  He states he had a hernia in the right groin region, 10 years ago but was never treated.  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  He has not seen urology previously or had prostate difficulty.  He thinks he may have had epididymitis in the past.  There are no other modifying factors.  Past Medical History:  Diagnosis Date  . Hernia, abdominal     There are no active problems to display for this patient.   History reviewed. No pertinent surgical history.      Home Medications    Prior to Admission medications   Medication Sig Start Date End Date Taking? Authorizing Provider  Multiple Vitamins-Minerals (ONE DAILY MULTIVITAMIN MEN PO) Take 1 tablet by mouth 3 (three) times a week.    Yes Provider, Historical, Noah Miller  albuterol (PROVENTIL HFA;VENTOLIN HFA) 108 (90 Base) MCG/ACT inhaler Inhale 2 puffs every 4 (four) hours as needed into the lungs for wheezing or shortness of breath (cough, shortness of breath or wheezing.). Patient not taking: Reported on 05/15/2019 10/02/17   Melynda Ripple, Noah Miller  albuterol (PROVENTIL HFA;VENTOLIN HFA) 108 (90 Base) MCG/ACT inhaler Inhale 1-2 puffs every 6 (six) hours as needed into the lungs for wheezing or shortness of breath. Patient not taking: Reported on 05/15/2019 10/02/17   Melynda Ripple, Noah Miller  benzonatate (TESSALON) 200 MG capsule Take 1 capsule (200 mg total) 3 (three) times daily as needed by mouth for cough. Patient not taking:  Reported on 05/15/2019 10/03/17   Melynda Ripple, Noah Miller  ciprofloxacin (CIPRO) 500 MG tablet Take 1 tablet (500 mg total) by mouth 2 (two) times daily. One po bid x 7 days 05/15/19   Daleen Bo, Noah Miller  fluticasone [facility]) 50 MCG/ACT nasal spray Place 2 sprays daily into both nostrils. Patient not taking: Reported on 05/15/2019 10/02/17   Melynda Ripple, Noah Miller  predniSONE (DELTASONE) 20 MG tablet Two daily with food Patient not taking: Reported on 05/15/2019 03/10/17   Robyn Haber, Noah Miller  Spacer/Aero-Holding Chambers (AEROCHAMBER PLUS) inhaler Use as instructed Patient not taking: Reported on 05/15/2019 10/02/17   Melynda Ripple, Noah Miller    Family History No family history on file.  Social History Social History   Tobacco Use  . Smoking status: Never Smoker  . Smokeless tobacco: Never Used  Substance Use Topics  . Alcohol use: No  . Drug use: No     Allergies   Patient has no known allergies.   Review of Systems Review of Systems  All other systems reviewed and are negative.    Physical Exam Updated Vital Signs BP 137/86 (BP Location: Left Arm)   Pulse 65   Temp 98.8 F (37.1 C) (Oral)   Resp 15   SpO2 100%   Physical Exam Vitals signs and nursing note reviewed.  Constitutional:      General: He is not in acute distress.    Appearance: He is well-developed. He is  not ill-appearing, toxic-appearing or diaphoretic.  HENT:     Head: Normocephalic and atraumatic.     Right Ear: External ear normal.     Left Ear: External ear normal.  Eyes:     Conjunctiva/sclera: Conjunctivae normal.     Pupils: Pupils are equal, round, and reactive to light.  Neck:     Musculoskeletal: Normal range of motion and neck supple.     Trachea: Phonation normal.  Cardiovascular:     Rate and Rhythm: Normal rate.  Pulmonary:     Effort: Pulmonary effort is normal.     Breath sounds: Normal breath sounds.  Abdominal:     General: There is no distension.     Palpations: Abdomen is  soft.     Tenderness: There is no abdominal tenderness.     Hernia: No hernia is present.  Genitourinary:    Comments: Normal-appearing penis and scrotum.  No urethral discharge.  Testicles palpable bilaterally normal size and texture.  Mild tenderness above the right testicle consistent with an area of the epididymis.  No significant mass in this area.  No palpable hernia within the scrotum or the inguinal canal. Musculoskeletal: Normal range of motion.  Skin:    General: Skin is warm and dry.  Neurological:     Mental Status: He is alert and oriented to person, place, and time.     Cranial Nerves: No cranial nerve deficit.     Sensory: No sensory deficit.     Motor: No abnormal muscle tone.     Coordination: Coordination normal.  Psychiatric:        Behavior: Behavior normal.        Thought Content: Thought content normal.        Judgment: Judgment normal.      ED Treatments / Results  Labs (all labs ordered are listed, but only abnormal results are displayed) Labs Reviewed  URINALYSIS, ROUTINE W REFLEX MICROSCOPIC    EKG None  Radiology No results found.  Procedures Procedures (including critical care time)  Medications Ordered in ED Medications - No data to display   Initial Impression / Assessment and Plan / ED Course  I have reviewed the triage vital signs and the nursing notes.  Pertinent labs & imaging results that were available during my care of the patient were reviewed by me and considered in my medical decision making (see chart for details).  Clinical Course as of May 15 1507  Thu May 15, 2019  1506 Normal   Urinalysis, Routine w reflex microscopic [EW]    Clinical Course User Index [EW] Noah Miller, Noah Mutchler, Noah Miller        Patient Vitals for the past 24 hrs:  BP Temp Temp src Pulse Resp SpO2  05/15/19 1400 137/86 - - 65 15 100 %  05/15/19 1300 136/79 - - 63 17 99 %  05/15/19 1221 (!) 131/91 - - 61 18 98 %  05/15/19 1133 126/81 98.8 F (37.1 C)  Oral 66 17 97 %    3:08 PM Reevaluation with update and discussion. After initial assessment and treatment, an updated evaluation reveals he remains comfortable has no further complaints, findings discussed and questions answered. Noah Miller   Medical Decision Making: Tender right epididymis without evidence for hernia.  Urinalysis does not indicate infection.  He may have an inflammatory epididymitis.  No urethral discharge concerning for STD.  He will be treated with a short course of antibiotics, in case he has a epididymitis, from a bacterial  source  CRITICAL CARE- No Performed by: Noah BaleElliott Tonishia Steffy  Nursing Notes Reviewed/ Care Coordinated Applicable Imaging Reviewed Interpretation of Laboratory Data incorporated into ED treatment  The patient appears reasonably screened and/or stabilized for discharge and I doubt any other medical condition or other [facility] requiring further screening, evaluation, or treatment in the ED at this time prior to discharge.  Plan: Home Medications-ibuprofen for pain; Home Treatments-drink plenty of fluids; return here if the recommended treatment, does not improve the symptoms; Recommended follow up-urology follow-up for persistent symptoms.   Final Clinical Impressions(s) / ED Diagnoses   Final diagnoses:  Epididymitis    ED Discharge Orders         Ordered    ciprofloxacin (CIPRO) 500 MG tablet  2 times daily     05/15/19 1508           Noah Miller, Lasheba Stevens, Noah Miller 05/15/19 [phone]

## 2019-05-15 NOTE — ED Triage Notes (Signed)
Per pt, states right abdominal pain radiating to right testicle-states he has a history of hernias-states swelling decreased with ice-states last hernia was 10 years ago-heavy lifting required at work

## 2019-05-15 NOTE — ED Notes (Signed)
Patient attempting to provide urine specimen.

## 2020-03-04 ENCOUNTER — Other Ambulatory Visit: Payer: Self-pay

## 2020-03-04 ENCOUNTER — Ambulatory Visit (HOSPITAL_COMMUNITY)
Admission: EM | Admit: 2020-03-04 | Discharge: 2020-03-04 | Disposition: A | Discharge status: Home / Self Care | Payer: Self-pay | Attending: Family Medicine | Admitting: Family Medicine

## 2020-03-04 ENCOUNTER — Encounter (HOSPITAL_COMMUNITY): Payer: Self-pay

## 2020-03-04 DIAGNOSIS — J302 Other seasonal allergic rhinitis: Secondary | ICD-10-CM

## 2020-03-04 DIAGNOSIS — J4521 Mild intermittent asthma with (acute) exacerbation: Secondary | ICD-10-CM

## 2020-03-04 MED ORDER — PREDNISONE 20 MG PO TABS: 40.0000 mg | ORAL_TABLET | Freq: Every day | ORAL | 0 refills | Status: AC

## 2020-03-04 MED ORDER — ALBUTEROL SULFATE HFA 108 (90 BASE) MCG/ACT IN AERS
2.0000 | INHALATION_SPRAY | Freq: Once | RESPIRATORY_TRACT | Status: AC
  Administered 2020-03-04: 2 via RESPIRATORY_TRACT

## 2020-03-04 MED ORDER — CETIRIZINE HCL 10 MG PO TABS: 10.0000 mg | ORAL_TABLET | Freq: Every day | ORAL | 11 refills | Status: AC

## 2020-03-04 MED ORDER — ALBUTEROL SULFATE HFA 108 (90 BASE) MCG/ACT IN AERS
INHALATION_SPRAY | RESPIRATORY_TRACT | Status: AC
  Filled 2020-03-04: qty 6.7

## 2020-03-04 NOTE — ED Provider Notes (Signed)
[facility]    CSN: [id number] Arrival date & time: 03/04/20  1041      History   Chief Complaint Chief Complaint  Patient presents with  . Medication Refill    HPI Noah Miller is a 44 y.o. male.   HPI  Patient presents with acute onset asthma flare last night.  Denies any exposure to COVID-19 or sick contacts.  He reports his asthma is triggered by changes in seasons.  He reports that his symptoms of wheezing or shortness of breath are worse at nighttime.  He has used his albuterol inhaler last night with relief of symptoms.  He is almost out of the albuterol.  He denies any chest tightness, headache, body ache, chills, or fever.  Past Medical History:  Diagnosis Date  . Hernia, abdominal     There are no problems to display for this patient.   History reviewed. No pertinent surgical history.     Home Medications    Prior to Admission medications   Medication Sig Start Date End Date Taking? Authorizing Provider  albuterol (PROVENTIL HFA;VENTOLIN HFA) 108 (90 Base) MCG/ACT inhaler Inhale 2 puffs every 4 (four) hours as needed into the lungs for wheezing or shortness of breath (cough, shortness of breath or wheezing.). Patient not taking: Reported on 05/15/2019 10/02/17   Melynda Ripple, MD  albuterol (PROVENTIL HFA;VENTOLIN HFA) 108 (90 Base) MCG/ACT inhaler Inhale 1-2 puffs every 6 (six) hours as needed into the lungs for wheezing or shortness of breath. Patient not taking: Reported on 05/15/2019 10/02/17   Melynda Ripple, MD  benzonatate (TESSALON) 200 MG capsule Take 1 capsule (200 mg total) 3 (three) times daily as needed by mouth for cough. Patient not taking: Reported on 05/15/2019 10/03/17   Melynda Ripple, MD  ciprofloxacin (CIPRO) 500 MG tablet Take 1 tablet (500 mg total) by mouth 2 (two) times daily. One po bid x 7 days 05/15/19   Daleen Bo, MD  fluticasone [facility]) 50 MCG/ACT nasal spray Place 2 sprays daily into both  nostrils. Patient not taking: Reported on 05/15/2019 10/02/17   Melynda Ripple, MD  Multiple Vitamins-Minerals (ONE DAILY MULTIVITAMIN MEN PO) Take 1 tablet by mouth 3 (three) times a week.     [provider]  predniSONE (DELTASONE) 20 MG tablet Two daily with food Patient not taking: Reported on 05/15/2019 03/10/17   Robyn Haber, MD  Spacer/Aero-Holding Chambers (AEROCHAMBER PLUS) inhaler Use as instructed Patient not taking: Reported on 05/15/2019 10/02/17   Melynda Ripple, MD    Family History History reviewed. No pertinent family history.  Social History Social History   Tobacco Use  . Smoking status: Never Smoker  . Smokeless tobacco: Never Used  Substance Use Topics  . Alcohol use: No  . Drug use: No     Allergies   Patient has no known allergies.   Review of Systems Review of Systems Pertinent negatives listed in HPI Physical Exam Triage Vital Signs ED Triage Vitals  Enc Vitals Group     BP 03/04/20 1202 128/77     Pulse Rate 03/04/20 1202 78     Resp 03/04/20 1202 18     Temp 03/04/20 1202 98.7 F (37.1 C)     Temp Source 03/04/20 1202 Oral     SpO2 03/04/20 1202 98 %     Weight 03/04/20 1201 200 lb (90.7 kg)     Height --      Head Circumference --      Peak Flow --  Pain Score 03/04/20 1201 0     Pain Loc --      Pain Edu? --      Excl. in GC? --    No data found.  Updated Vital Signs BP 128/77 (BP Location: Right Arm)   Pulse 78   Temp 98.7 F (37.1 C) (Oral)   Resp 18   Wt 200 lb (90.7 kg)   SpO2 98%   Visual Acuity Right Eye Distance:   Left Eye Distance:   Bilateral Distance:    Right Eye Near:   Left Eye Near:    Bilateral Near:     Physical Exam Constitutional:      Appearance: Normal appearance. He is not ill-appearing.  HENT:     Head: Normocephalic.     Nose: Congestion present.  Eyes:     Extraocular Movements: Extraocular movements intact.     Pupils: Pupils are equal, round, and reactive to  light.  Cardiovascular:     Rate and Rhythm: Normal rate and regular rhythm.  Pulmonary:     Effort: Pulmonary effort is normal.     Breath sounds: Normal air entry.     Comments: Diminished lung sound, no wheezing or rhonchi noted during exam. Skin:    General: Skin is warm and dry.  Neurological:     General: No focal deficit present.     Mental Status: He is alert and oriented to person, place, and time.  Psychiatric:        Mood and Affect: Mood normal.        Behavior: Behavior normal.      UC Treatments / Results  Labs (all labs ordered are listed, but only abnormal results are displayed) Labs Reviewed - No data to display  EKG   Radiology No results found.  Procedures Procedures (including critical care time)  Medications Ordered in UC Medications - No data to display  Initial Impression / Assessment and Plan / UC Course  I have reviewed the triage vital signs and the nursing notes.  Pertinent labs & imaging results that were available during my care of the patient were reviewed by me and considered in my medical decision making (see chart for details).    Mild intermittent asthma with acute exacerbation Continue albuterol 2 puffs every 4-6 hours as needed for shortness of breath and/or wheezing Start prednisone 40 mg with breakfast once daily for 5 days No concern at present for bronchitis therefore will not cover with an antibiotic.  Seasonal allergies -Start Cetrizine 10 mg at bedtime -Resume daily Flonase Final Clinical Impressions(s) / UC Diagnoses   Final diagnoses:  Mild intermittent asthma with acute exacerbation  Seasonal allergies     Discharge Instructions     Take medication as directed.  If symptoms worsen or do not improve return here for follow-up evaluation.    ED Prescriptions    Medication Sig Dispense Auth. Provider   predniSONE (DELTASONE) 20 MG tablet Take 2 tablets (40 mg total) by mouth daily with breakfast for 5 days. 10  tablet Bing Neighbors, FNP   cetirizine (ZYRTEC) 10 MG tablet Take 1 tablet (10 mg total) by mouth daily. 30 tablet Bing Neighbors, FNP     PDMP not reviewed this encounter.   Bing Neighbors, FNP 03/04/20 1313

## 2020-03-04 NOTE — ED Triage Notes (Signed)
Pt states he needs a med refill on his allergy meds and he says his asthma flares up at night  because of the pollen.

## 2020-03-04 NOTE — Discharge Instructions (Addendum)
Take medication as directed.  If symptoms worsen or do not improve return here for follow-up evaluation.

## 2021-07-08 ENCOUNTER — Other Ambulatory Visit: Payer: Self-pay

## 2021-07-08 ENCOUNTER — Ambulatory Visit (HOSPITAL_COMMUNITY)
Admission: EM | Admit: 2021-07-08 | Discharge: 2021-07-08 | Disposition: A | Discharge status: Home / Self Care | Payer: Self-pay | Attending: Internal Medicine | Admitting: Internal Medicine

## 2021-07-08 ENCOUNTER — Encounter (HOSPITAL_COMMUNITY): Payer: Self-pay

## 2021-07-08 DIAGNOSIS — N3001 Acute cystitis with hematuria: Secondary | ICD-10-CM

## 2021-07-08 LAB — POCT URINALYSIS DIPSTICK, ED / UC
Bilirubin Urine: NEGATIVE
Glucose, UA: NEGATIVE mg/dL
Ketones, ur: NEGATIVE mg/dL
Nitrite: NEGATIVE
Protein, ur: NEGATIVE mg/dL
Specific Gravity, Urine: 1.015 (ref 1.005–1.030)
Urobilinogen, UA: 0.2 mg/dL (ref 0.0–1.0)
pH: 8.5 — ABNORMAL HIGH (ref 5.0–8.0)

## 2021-07-08 MED ORDER — CEPHALEXIN 500 MG PO CAPS: 500.0000 mg | ORAL_CAPSULE | Freq: Two times a day (BID) | ORAL | 0 refills | Status: AC

## 2021-07-08 NOTE — Discharge Instructions (Addendum)
Please take medications as directed We will call you with recommendations if labs are abnormal Return to urgent care if you are worsening symptoms.

## 2021-07-08 NOTE — ED Triage Notes (Signed)
Pt presents with c/o dysuria X 5 days. Pt also c/o penile pain.  Pt denies other sxs.

## 2021-07-10 LAB — URINE CULTURE: Culture: 60000 — AB

## 2021-07-11 NOTE — ED Provider Notes (Signed)
[facility]    CSN: [id number] Arrival date & time: 07/08/21  1015      History   Chief Complaint Chief Complaint  Patient presents with   Urinary Tract Infection    HPI Noah Miller is a 45 y.o. male comes to the urgent care with a 5-day history of dysuria, urgency without frequency.  Patient also complains of penile discomfort.  Penile discomfort is mainly at the tip.  No rash on the penis.  No history of urinary tract infections.  Patient denies any pelvic pain or pain with having bowel movements.  No history of constipation.  No fever, chills, flank pain, nausea or vomiting.  Patient is not sexually active  HPI  Past Medical History:  Diagnosis Date   Hernia, abdominal     There are no problems to display for this patient.   History reviewed. No pertinent surgical history.     Home Medications    Prior to Admission medications   Medication Sig Start Date End Date Taking? Authorizing Provider  cephALEXin (KEFLEX) 500 MG capsule Take 1 capsule (500 mg total) by mouth 2 (two) times daily for 5 days. 07/08/21 07/13/21 Yes Ozzy Bohlken, Britta Mccreedy, MD  cetirizine (ZYRTEC) 10 MG tablet Take 1 tablet (10 mg total) by mouth daily. 03/04/20   Bing Neighbors, FNP  ciprofloxacin (CIPRO) 500 MG tablet Take 1 tablet (500 mg total) by mouth 2 (two) times daily. One po bid x 7 days 05/15/19   Mancel Bale, MD  Multiple Vitamins-Minerals (ONE DAILY MULTIVITAMIN MEN PO) Take 1 tablet by mouth 3 (three) times a week.     [provider]    Family History History reviewed. No pertinent family history.  Social History Social History   Tobacco Use   Smoking status: Never   Smokeless tobacco: Never  Substance Use Topics   Alcohol use: No   Drug use: No     Allergies   Patient has no known allergies.   Review of Systems Review of Systems  Gastrointestinal:  Negative for abdominal pain, nausea and vomiting.  Genitourinary:  Positive for dysuria,  penile pain and urgency. Negative for penile swelling and scrotal swelling.    Physical Exam Triage Vital Signs ED Triage Vitals  Enc Vitals Group     BP 07/08/21 1146 130/82     Pulse Rate 07/08/21 1145 73     Resp 07/08/21 1145 17     Temp 07/08/21 1145 97.7 F (36.5 C)     Temp Source 07/08/21 1145 Oral     SpO2 07/08/21 1145 98 %     Weight --      Height --      Head Circumference --      Peak Flow --      Pain Score 07/08/21 1144 10     Pain Loc --      Pain Edu? --      Excl. in GC? --    No data found.  Updated Vital Signs BP 130/82   Pulse 73   Temp 97.7 F (36.5 C) (Oral)   Resp 17   SpO2 98%   Visual Acuity Right Eye Distance:   Left Eye Distance:   Bilateral Distance:    Right Eye Near:   Left Eye Near:    Bilateral Near:     Physical Exam Vitals and nursing note reviewed.  Constitutional:      General: He is not in acute distress.  Appearance: He is not ill-appearing.  Cardiovascular:     Rate and Rhythm: Normal rate and regular rhythm.  Abdominal:     General: Bowel sounds are normal.     Palpations: Abdomen is soft.  Genitourinary:    Testes: Normal.  Neurological:     Mental Status: He is alert.     UC Treatments / Results  Labs (all labs ordered are listed, but only abnormal results are displayed) Labs Reviewed  URINE CULTURE - Abnormal; Notable for the following components:      Result Value   Culture 60,000 COLONIES/mL CITROBACTER KOSERI (*)    Organism ID, Bacteria CITROBACTER KOSERI (*)    All other components within normal limits  POCT URINALYSIS DIPSTICK, ED / UC - Abnormal; Notable for the following components:   Hgb urine dipstick MODERATE (*)    pH 8.5 (*)    Leukocytes,Ua SMALL (*)    All other components within normal limits    EKG   Radiology No results found.  Procedures Procedures (including critical care time)  Medications Ordered in UC Medications - No data to display  Initial Impression /  Assessment and Plan / UC Course  I have reviewed the triage vital signs and the nursing notes.  Pertinent labs & imaging results that were available during my care of the patient were reviewed by me and considered in my medical decision making (see chart for details).     1.  Acute cystitis with hematuria: Point-of-care urinalysis is significant for leukocyte Estrace, hemoglobin Urine cultures have been sent Keflex 500 mg twice daily for 5 days We will call patient with recommendations if cultures require antibiotics to be changed Return precautions given. Final Clinical Impressions(s) / UC Diagnoses   Final diagnoses:  Acute cystitis with hematuria     Discharge Instructions      Please take medications as directed We will call you with recommendations if labs are abnormal Return to urgent care if you are worsening symptoms.   ED Prescriptions     Medication Sig Dispense Auth. Provider   cephALEXin (KEFLEX) 500 MG capsule Take 1 capsule (500 mg total) by mouth 2 (two) times daily for 5 days. 20 capsule Noah Miller, Britta Mccreedy, MD      PDMP not reviewed this encounter.   Merrilee Jansky, MD 07/11/21 [phone]

## 2021-08-21 ENCOUNTER — Other Ambulatory Visit: Payer: Self-pay

## 2021-08-21 ENCOUNTER — Encounter (HOSPITAL_COMMUNITY): Payer: Self-pay | Admitting: Emergency Medicine

## 2021-08-21 ENCOUNTER — Ambulatory Visit (HOSPITAL_COMMUNITY)
Admission: EM | Admit: 2021-08-21 | Discharge: 2021-08-21 | Disposition: A | Discharge status: Home / Self Care | Payer: Self-pay | Attending: Emergency Medicine | Admitting: Emergency Medicine

## 2021-08-21 DIAGNOSIS — M62838 Other muscle spasm: Secondary | ICD-10-CM

## 2021-08-21 DIAGNOSIS — M542 Cervicalgia: Secondary | ICD-10-CM

## 2021-08-21 DIAGNOSIS — M545 Low back pain, unspecified: Secondary | ICD-10-CM

## 2021-08-21 MED ORDER — BACLOFEN 10 MG PO TABS: 10.0000 mg | ORAL_TABLET | Freq: Every day | ORAL | 0 refills | Status: AC

## 2021-08-21 MED ORDER — IBUPROFEN 800 MG PO TABS: 800.0000 mg | ORAL_TABLET | Freq: Three times a day (TID) | ORAL | 0 refills | Status: AC

## 2021-08-21 MED ORDER — KETOROLAC TROMETHAMINE 60 MG/2ML IM SOLN
INTRAMUSCULAR | Status: AC
  Filled 2021-08-21: qty 2

## 2021-08-21 MED ORDER — KETOROLAC TROMETHAMINE 60 MG/2ML IM SOLN
60.0000 mg | Freq: Once | INTRAMUSCULAR | Status: AC
  Administered 2021-08-21: 60 mg via INTRAMUSCULAR

## 2021-08-21 NOTE — Discharge Instructions (Addendum)
You received an injection of ketorolac today.  This is a very strong anti-inflammatory pain reliever.  I have sent you prescription for ibuprofen 800 mg to your pharmacy, for the next 3 days please take it 3 times daily starting this evening.  I have also sent you a prescription for baclofen which is a muscle relaxer that you can take nightly for the next 10 days as needed.  Please feel free to return to urgent care for repeat ketorolac injection on Tuesday if you feel that your pain is not completely resolved by ibuprofen and baclofen.
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
## 2021-09-06 ENCOUNTER — Encounter (HOSPITAL_COMMUNITY): Payer: Self-pay | Admitting: *Deleted

## 2021-09-06 ENCOUNTER — Other Ambulatory Visit: Payer: Self-pay

## 2021-09-06 ENCOUNTER — Ambulatory Visit (HOSPITAL_COMMUNITY)
Admission: EM | Admit: 2021-09-06 | Discharge: 2021-09-06 | Disposition: A | Discharge status: Home / Self Care | Payer: Self-pay | Attending: Physician Assistant | Admitting: Physician Assistant

## 2021-09-06 DIAGNOSIS — M25511 Pain in right shoulder: Secondary | ICD-10-CM

## 2021-09-06 DIAGNOSIS — S46811A Strain of other muscles, fascia and tendons at shoulder and upper arm level, right arm, initial encounter: Secondary | ICD-10-CM

## 2021-09-06 MED ORDER — TIZANIDINE HCL 4 MG PO CAPS: 4.0000 mg | ORAL_CAPSULE | Freq: Three times a day (TID) | ORAL | 0 refills | Status: AC | PRN

## 2021-09-06 MED ORDER — PREDNISONE 10 MG (21) PO TBPK: ORAL_TABLET | ORAL | 0 refills | Status: AC

## 2021-09-06 NOTE — ED Triage Notes (Signed)
Pt reports shoulder pain more on RT than Lt.

## 2021-09-06 NOTE — Discharge Instructions (Signed)
I believe that you strained your trapezius.  Please start prednisone taper as we discussed.  Do not take NSAIDs including aspirin, ibuprofen/Advil, naproxen/Aleve with this medication as it can cause stomach bleeding.  You can use Tylenol for breakthrough pain.  I have also called in a muscle relaxer known as tizanidine.  He can take this 3 times a day but it will make you sleepy.  Use heat, rest, stretch for additional symptom relief.  You may benefit from physical therapy as I recommend he follow-up with sports medicine provider for evaluation as we discussed.  If you have any worsening symptoms or if things or not improving please return for reevaluation as we discussed.
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
## 2022-08-11 ENCOUNTER — Ambulatory Visit (HOSPITAL_COMMUNITY)
Admission: EM | Admit: 2022-08-11 | Discharge: 2022-08-11 | Disposition: A | Discharge status: Home / Self Care | Payer: Self-pay | Attending: Emergency Medicine | Admitting: Emergency Medicine

## 2022-08-11 ENCOUNTER — Encounter (HOSPITAL_COMMUNITY): Payer: Self-pay

## 2022-08-11 DIAGNOSIS — Z1152 Encounter for screening for COVID-19: Secondary | ICD-10-CM | POA: Insufficient documentation

## 2022-08-11 DIAGNOSIS — B349 Viral infection, unspecified: Secondary | ICD-10-CM | POA: Insufficient documentation

## 2022-08-11 LAB — RESP PANEL BY RT-PCR (FLU A&B, COVID) ARPGX2
Influenza A by PCR: NEGATIVE
Influenza B by PCR: NEGATIVE
SARS Coronavirus 2 by RT PCR: NEGATIVE

## 2022-08-11 MED ORDER — ALBUTEROL SULFATE HFA 108 (90 BASE) MCG/ACT IN AERS: 1.0000 | INHALATION_SPRAY | Freq: Four times a day (QID) | RESPIRATORY_TRACT | 0 refills | Status: AC | PRN

## 2022-08-11 NOTE — ED Triage Notes (Signed)
Pt states cough and congestion for the past 2 days. 

## 2022-08-11 NOTE — Discharge Instructions (Addendum)
I recommend symptomatic care at home Try mucinex (guaifenesin) twice daily for cough and congestion You can use tylenol ibuprofen for any aches/fever  Please increase your fluid intake  We will call you if your covid/flu test returns positive.  You can return to urgent care if symptoms persist despite medication

## 2022-08-11 NOTE — ED Provider Notes (Signed)
Noah Miller    CSN: [id number] Arrival date & time: 08/11/22  [id number]      History   Chief Complaint Chief Complaint  Patient presents with   Cough    HPI Noah Miller is a 46 y.o. male.  Presents with 1 day history of congestion and cough Reports some nasal congestion, productive cough with clear phlegm Tactile fever last night  Took BC powder  No known sick contacts  History of asthma, no recent exacerbations. Usually needs around seasonal changes  Past Medical History:  Diagnosis Date   Hernia, abdominal     There are no problems to display for this patient.   History reviewed. No pertinent surgical history.     Home Medications    Prior to Admission medications   Medication Sig Start Date End Date Taking? Authorizing Provider  albuterol (VENTOLIN HFA) 108 (90 Base) MCG/ACT inhaler Inhale 1-2 puffs into the lungs every 6 (six) hours as needed for wheezing or shortness of breath. 08/11/22  Yes Dericka Ostenson, Wells Guiles, PA-C  cetirizine (ZYRTEC) 10 MG tablet Take 1 tablet (10 mg total) by mouth daily. 03/04/20   Scot Jun, FNP  Multiple Vitamins-Minerals (ONE DAILY MULTIVITAMIN MEN PO) Take 1 tablet by mouth 3 (three) times a week.     [provider]  predniSONE (STERAPRED UNI-PAK 21 TAB) 10 MG (21) TBPK tablet As directed 09/06/21   Raspet, Erin K, PA-C  tiZANidine (ZANAFLEX) 4 MG capsule Take 1 capsule (4 mg total) by mouth 3 (three) times daily as needed for muscle spasms. 09/06/21   Raspet, Derry Skill, PA-C    Family History History reviewed. No pertinent family history.  Social History Social History   Tobacco Use   Smoking status: Never   Smokeless tobacco: Never  Vaping Use   Vaping Use: Never used  Substance Use Topics   Alcohol use: No   Drug use: No     Allergies   Patient has no known allergies.   Review of Systems Review of Systems  Respiratory:  Positive for cough.    Per HPI  Physical Exam Triage Vital  Signs ED Triage Vitals  Enc Vitals Group     BP 08/11/22 1021 131/83     Pulse Rate 08/11/22 1021 93     Resp 08/11/22 1021 16     Temp 08/11/22 1021 99.5 F (37.5 C)     Temp Source 08/11/22 1021 Oral     SpO2 08/11/22 1021 95 %     Weight --      Height --      Head Circumference --      Peak Flow --      Pain Score 08/11/22 1023 0     Pain Loc --      Pain Edu? --      Excl. in [place]? --    No data found.  Updated Vital Signs BP 131/83 (BP Location: Left Arm)   Pulse 93   Temp 99.5 F (37.5 C) (Oral)   Resp 16   SpO2 95%    Physical Exam Vitals and nursing note reviewed.  Constitutional:      General: He is not in acute distress. HENT:     Mouth/Throat:     Mouth: Mucous membranes are moist.     Pharynx: Uvula midline. No posterior oropharyngeal erythema.     Tonsils: No tonsillar exudate or tonsillar abscesses.  Eyes:     Conjunctiva/sclera: Conjunctivae normal.  Cardiovascular:  Rate and Rhythm: Normal rate and regular rhythm.     Heart sounds: Normal heart sounds.  Pulmonary:     Effort: Pulmonary effort is normal. No respiratory distress.     Breath sounds: Normal breath sounds. No wheezing.  Abdominal:     General: Bowel sounds are normal.     Palpations: Abdomen is soft.     Tenderness: There is no abdominal tenderness.  Musculoskeletal:     Cervical back: Normal range of motion.  Lymphadenopathy:     Cervical: No cervical adenopathy.  Neurological:     Mental Status: He is alert and oriented to person, place, and time.      UC Treatments / Results  Labs (all labs ordered are listed, but only abnormal results are displayed) Labs Reviewed  RESP PANEL BY RT-PCR (FLU A&B, COVID) [id number]    EKG  Radiology No results found.  Procedures Procedures (including critical care time)  Medications Ordered in UC Medications - No data to display  Initial Impression / Assessment and Plan / UC Course  I have reviewed the triage vital signs and  the nursing notes.  Pertinent labs & imaging results that were available during my care of the patient were reviewed by me and considered in my medical decision making (see chart for details).  Viral illness Covid and flu tests pending. If positive, would like antivirals. No recent GFR, can do renal dosing. Discussed symptomatic care at home Sent albuterol inhaler refill. Work note provided.  Discussed return precautions to which patient agrees to plan  Final Clinical Impressions(s) / UC Diagnoses   Final diagnoses:  Viral illness  Encounter for screening for COVID-19     Discharge Instructions      I recommend symptomatic care at home Try mucinex (guaifenesin) twice daily for cough and congestion You can use tylenol ibuprofen for any aches/fever  Please increase your fluid intake  We will call you if your covid/flu test returns positive.  You can return to urgent care if symptoms persist despite medication    ED Prescriptions     Medication Sig Dispense Auth. Provider   albuterol (VENTOLIN HFA) 108 (90 Base) MCG/ACT inhaler Inhale 1-2 puffs into the lungs every 6 (six) hours as needed for wheezing or shortness of breath. 18 g Acire Tang, Wells Guiles, PA-C      PDMP not reviewed this encounter.   Taquila Leys, Wells Guiles, PA-C 08/11/22 1100

## 2022-08-14 ENCOUNTER — Encounter (HOSPITAL_COMMUNITY): Payer: Self-pay | Admitting: Emergency Medicine

## 2022-08-14 ENCOUNTER — Ambulatory Visit (HOSPITAL_COMMUNITY)
Admission: EM | Admit: 2022-08-14 | Discharge: 2022-08-14 | Disposition: A | Discharge status: Home / Self Care | Payer: Self-pay

## 2022-08-14 DIAGNOSIS — J069 Acute upper respiratory infection, unspecified: Secondary | ICD-10-CM

## 2022-08-14 NOTE — Discharge Instructions (Signed)
Advise continue medications until completed. Advise to follow-up PCP or return to urgent care as needed.

## 2022-08-14 NOTE — ED Triage Notes (Signed)
Pt requesting a work note. States he was seen here on 9/22 for a cough and was told he had to be seen again for a work note. Respiratory panel was negative.

## 2022-08-14 NOTE — ED Provider Notes (Signed)
[facility]    CSN: [id number] Arrival date & time: 08/14/22  1534      History   Chief Complaint Chief Complaint  Patient presents with   Letter for School/Work    HPI Noah Miller is a 46 y.o. male.   26-year-old male presents for reevaluation.  Patient indicates that he was seen on 922 and treated for respiratory infection.  He indicates that he was given some Mucinex DM an inhaler to use which worked to control his cough, chest congestion and shortness of breath.  Patient indicates today he is feeling better, he is without fever, and his symptoms have mostly resolved.  He requests a note to be allowed to return to work tomorrow.  He also desires to be rechecked and have the provider "listen to my lungs to make sure everything is cleared".  He denies any fever or chills     Past Medical History:  Diagnosis Date   Hernia, abdominal     There are no problems to display for this patient.   History reviewed. No pertinent surgical history.     Home Medications    Prior to Admission medications   Medication Sig Start Date End Date Taking? Authorizing Provider  albuterol (VENTOLIN HFA) 108 (90 Base) MCG/ACT inhaler Inhale 1-2 puffs into the lungs every 6 (six) hours as needed for wheezing or shortness of breath. 08/11/22   Rising, Wells Guiles, PA-C  cetirizine (ZYRTEC) 10 MG tablet Take 1 tablet (10 mg total) by mouth daily. 03/04/20   Scot Jun, FNP  Multiple Vitamins-Minerals (ONE DAILY MULTIVITAMIN MEN PO) Take 1 tablet by mouth 3 (three) times a week.     [provider]  predniSONE (STERAPRED UNI-PAK 21 TAB) 10 MG (21) TBPK tablet As directed 09/06/21   Raspet, Erin K, PA-C  tiZANidine (ZANAFLEX) 4 MG capsule Take 1 capsule (4 mg total) by mouth 3 (three) times daily as needed for muscle spasms. 09/06/21   Raspet, Derry Skill, PA-C    Family History History reviewed. No pertinent family history.  Social History Social History   Tobacco Use    Smoking status: Never   Smokeless tobacco: Never  Vaping Use   Vaping Use: Never used  Substance Use Topics   Alcohol use: No   Drug use: No     Allergies   Patient has no known allergies.   Review of Systems Review of Systems  Respiratory:  Positive for cough.      Physical Exam Triage Vital Signs ED Triage Vitals  Enc Vitals Group     BP 08/14/22 1605 137/86     Pulse Rate 08/14/22 1605 70     Resp 08/14/22 1605 17     Temp 08/14/22 1605 97.9 F (36.6 C)     Temp Source 08/14/22 1605 Oral     SpO2 08/14/22 1605 96 %     Weight --      Height --      Head Circumference --      Peak Flow --      Pain Score 08/14/22 1604 0     Pain Loc --      Pain Edu? --      Excl. in [place]? --    No data found.  Updated Vital Signs BP 137/86 (BP Location: Left Arm)   Pulse 70   Temp 97.9 F (36.6 C) (Oral)   Resp 17   SpO2 96%   Visual Acuity Right Eye Distance:  Left Eye Distance:   Bilateral Distance:    Right Eye Near:   Left Eye Near:    Bilateral Near:     Physical Exam Constitutional:      Appearance: Normal appearance.  HENT:     Right Ear: Tympanic membrane and ear canal normal.     Left Ear: Tympanic membrane and ear canal normal.     Mouth/Throat:     Mouth: Mucous membranes are moist.     Pharynx: Oropharynx is clear.  Cardiovascular:     Rate and Rhythm: Normal rate and regular rhythm.     Heart sounds: Normal heart sounds.  Pulmonary:     Effort: Pulmonary effort is normal.     Breath sounds: Normal breath sounds and air entry. No wheezing, rhonchi or rales.  Lymphadenopathy:     Cervical: No cervical adenopathy.  Neurological:     Mental Status: He is alert.      UC Treatments / Results  Labs (all labs ordered are listed, but only abnormal results are displayed) Labs Reviewed - No data to display  EKG   Radiology No results found.  Procedures Procedures (including critical care time)  Medications Ordered in  UC Medications - No data to display  Initial Impression / Assessment and Plan / UC Course  I have reviewed the triage vital signs and the nursing notes.  Pertinent labs & imaging results that were available during my care of the patient were reviewed by me and considered in my medical decision making (see chart for details).    Final Clinical Impressions(s) / UC Diagnoses   Final diagnoses:  Acute upper respiratory infection   Plan: 1.  Simple recheck for respiratory infection: A.  Advised to continue medications until completed. B.  Note given to return to work Tuesday. C.  Advised to follow-up PCP or return to urgent care as needed   Discharge Instructions      Advise continue medications until completed. Advise to follow-up PCP or return to urgent care as needed.    ED Prescriptions   None    PDMP not reviewed this encounter.   Nyoka Lint, PA-C 08/14/22 1629
